# Patient Record
Sex: Male | Born: 1945 | Race: White | Hispanic: No | Marital: Married | Smoking: Never smoker
Health system: Southern US, Community
[De-identification: ages and names within clinical notes are randomized; demographics above are authoritative.]

## PROBLEM LIST (undated history)

## (undated) DIAGNOSIS — Z9889 Other specified postprocedural states: Secondary | ICD-10-CM

## (undated) DIAGNOSIS — C801 Malignant (primary) neoplasm, unspecified: Secondary | ICD-10-CM

## (undated) DIAGNOSIS — I10 Essential (primary) hypertension: Secondary | ICD-10-CM

## (undated) DIAGNOSIS — M1990 Unspecified osteoarthritis, unspecified site: Secondary | ICD-10-CM

## (undated) DIAGNOSIS — K219 Gastro-esophageal reflux disease without esophagitis: Secondary | ICD-10-CM

## (undated) HISTORY — PX: PILONIDAL CYST / SINUS EXCISION: SUR543

---

## 2004-10-05 ENCOUNTER — Observation Stay (HOSPITAL_COMMUNITY): Admission: EM | Admit: 2004-10-05 | Discharge: 2004-10-05 | Payer: Self-pay | Admitting: Emergency Medicine

## 2006-10-16 IMAGING — CR DG CHEST 2V
2 series · 2 of 2 positions shown · non-contrast
Comparison: None.

CLINICAL DATA: Chest pain and tightness.  Pain in throat.  
 CHEST - 2 VIEW:

[w chest pa]
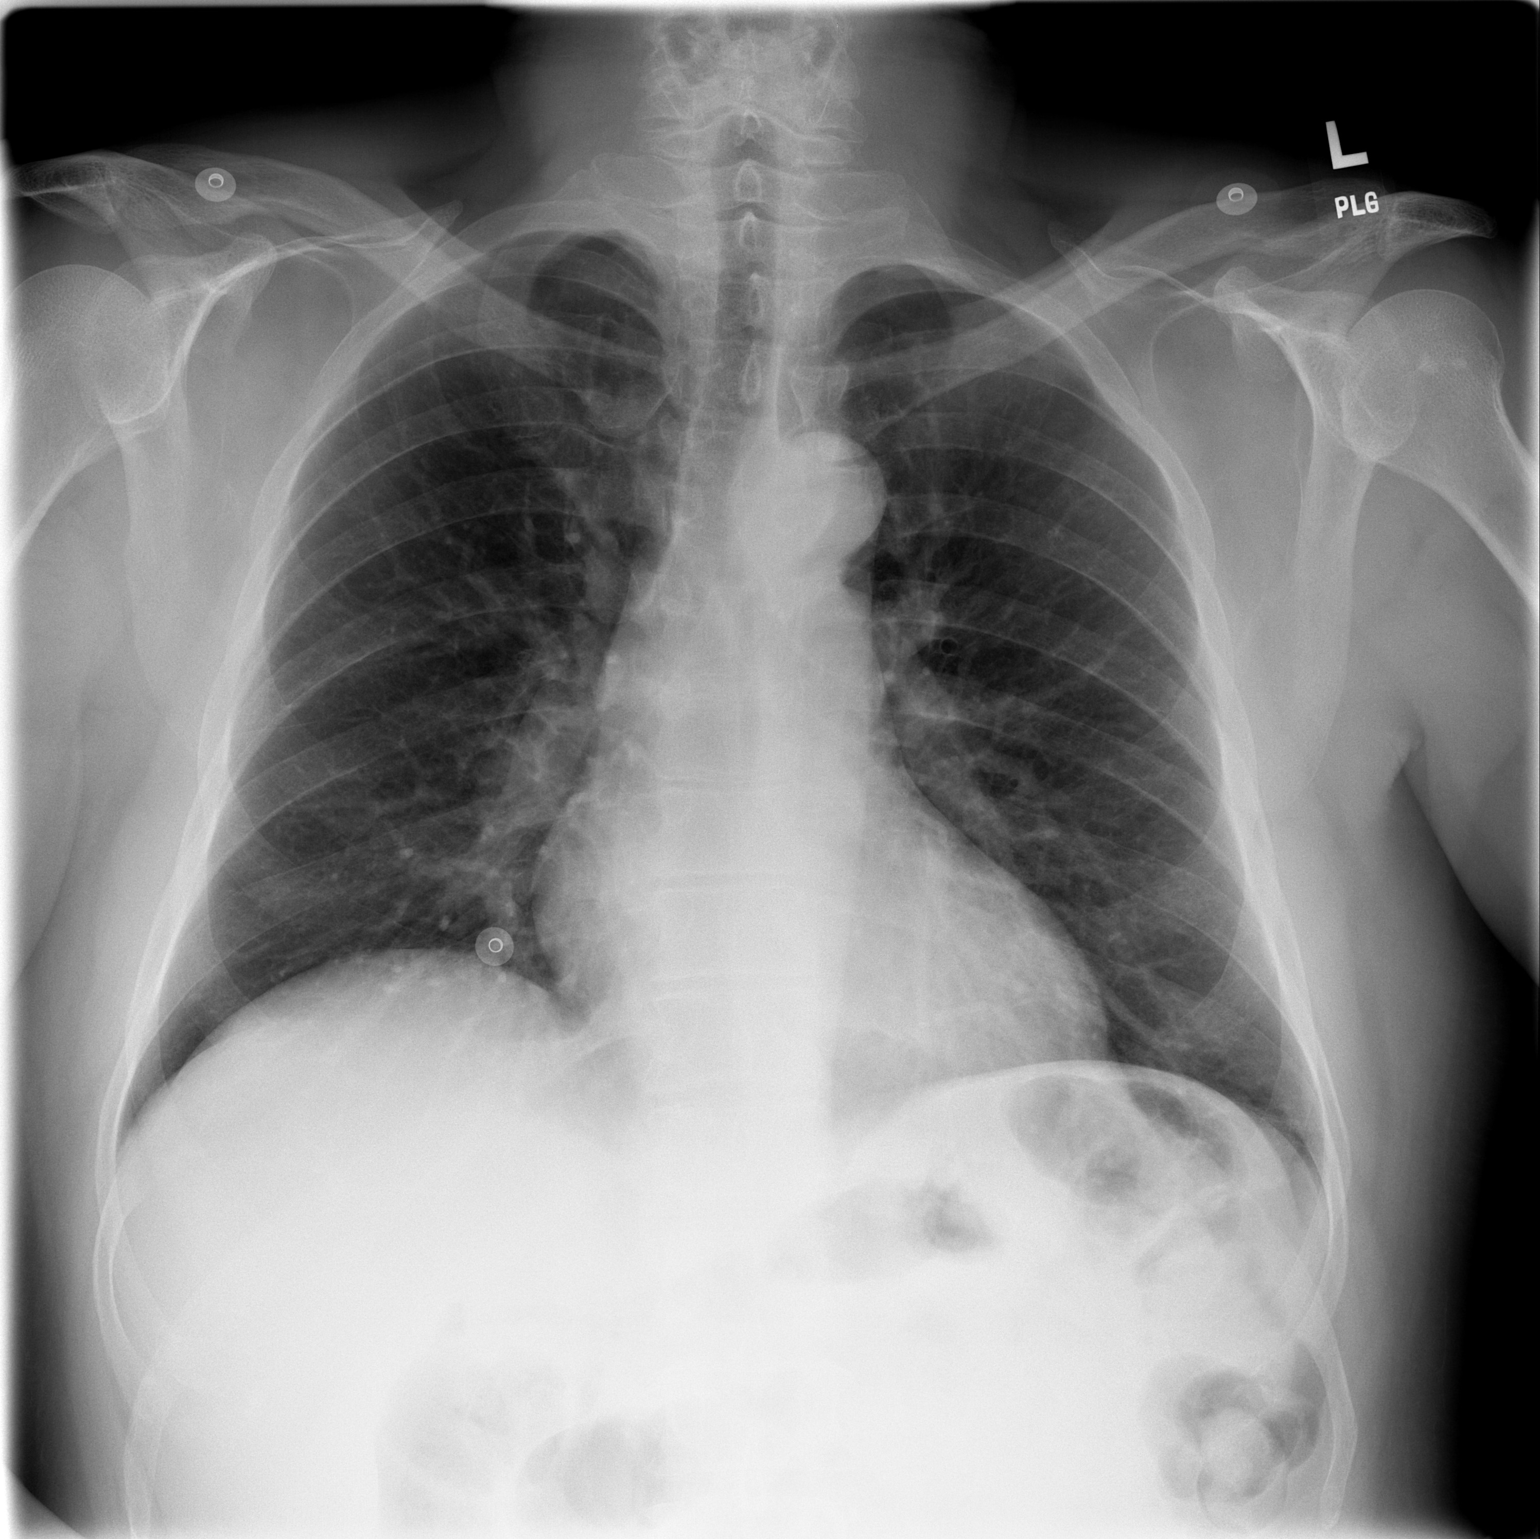

[w chest lat]
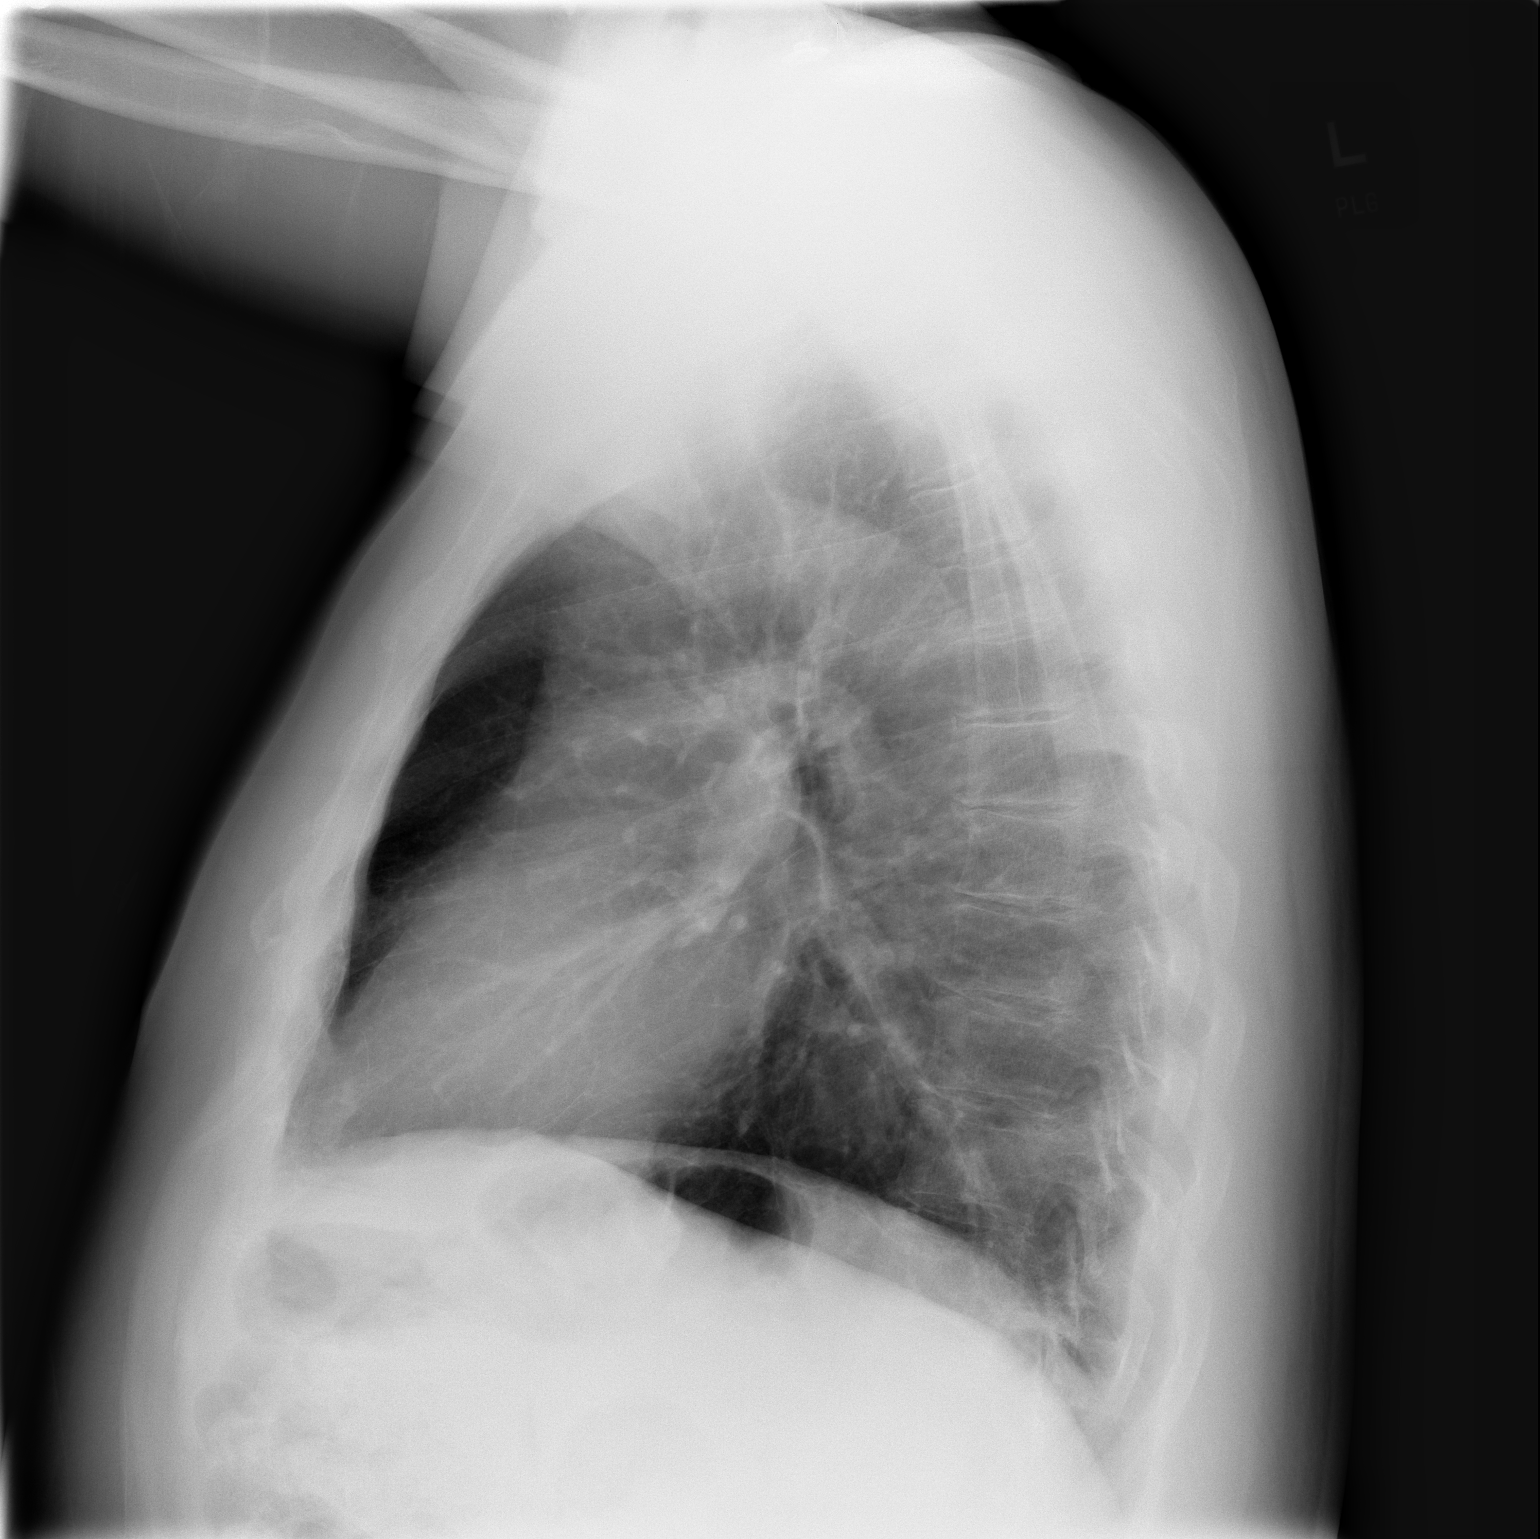

[2 of 2 positions shown; findings below may reference images not displayed]

Lungs are clear.  Heart size is normal.  No pleural effusion.  No focal bony abnormality.
IMPRESSION: No acute disease.

## 2011-06-07 DIAGNOSIS — M25519 Pain in unspecified shoulder: Secondary | ICD-10-CM | POA: Diagnosis not present

## 2011-08-16 DIAGNOSIS — H524 Presbyopia: Secondary | ICD-10-CM | POA: Diagnosis not present

## 2011-08-16 DIAGNOSIS — H259 Unspecified age-related cataract: Secondary | ICD-10-CM | POA: Diagnosis not present

## 2011-08-18 DIAGNOSIS — Z125 Encounter for screening for malignant neoplasm of prostate: Secondary | ICD-10-CM | POA: Diagnosis not present

## 2011-08-18 DIAGNOSIS — N400 Enlarged prostate without lower urinary tract symptoms: Secondary | ICD-10-CM | POA: Diagnosis not present

## 2011-08-18 DIAGNOSIS — Z1331 Encounter for screening for depression: Secondary | ICD-10-CM | POA: Diagnosis not present

## 2011-08-18 DIAGNOSIS — G479 Sleep disorder, unspecified: Secondary | ICD-10-CM | POA: Diagnosis not present

## 2011-08-18 DIAGNOSIS — I10 Essential (primary) hypertension: Secondary | ICD-10-CM | POA: Diagnosis not present

## 2011-08-18 DIAGNOSIS — Z23 Encounter for immunization: Secondary | ICD-10-CM | POA: Diagnosis not present

## 2011-09-05 DIAGNOSIS — R972 Elevated prostate specific antigen [PSA]: Secondary | ICD-10-CM | POA: Diagnosis not present

## 2011-10-10 DIAGNOSIS — R972 Elevated prostate specific antigen [PSA]: Secondary | ICD-10-CM | POA: Diagnosis not present

## 2011-11-11 DIAGNOSIS — L570 Actinic keratosis: Secondary | ICD-10-CM | POA: Diagnosis not present

## 2011-11-11 DIAGNOSIS — L821 Other seborrheic keratosis: Secondary | ICD-10-CM | POA: Diagnosis not present

## 2012-01-31 DIAGNOSIS — G479 Sleep disorder, unspecified: Secondary | ICD-10-CM | POA: Diagnosis not present

## 2012-01-31 DIAGNOSIS — E785 Hyperlipidemia, unspecified: Secondary | ICD-10-CM | POA: Diagnosis not present

## 2012-01-31 DIAGNOSIS — Z23 Encounter for immunization: Secondary | ICD-10-CM | POA: Diagnosis not present

## 2012-01-31 DIAGNOSIS — R7301 Impaired fasting glucose: Secondary | ICD-10-CM | POA: Diagnosis not present

## 2012-01-31 DIAGNOSIS — I10 Essential (primary) hypertension: Secondary | ICD-10-CM | POA: Diagnosis not present

## 2012-08-02 DIAGNOSIS — I10 Essential (primary) hypertension: Secondary | ICD-10-CM | POA: Diagnosis not present

## 2012-08-02 DIAGNOSIS — N400 Enlarged prostate without lower urinary tract symptoms: Secondary | ICD-10-CM | POA: Diagnosis not present

## 2012-08-02 DIAGNOSIS — R7301 Impaired fasting glucose: Secondary | ICD-10-CM | POA: Diagnosis not present

## 2012-08-02 DIAGNOSIS — E785 Hyperlipidemia, unspecified: Secondary | ICD-10-CM | POA: Diagnosis not present

## 2012-08-02 DIAGNOSIS — G479 Sleep disorder, unspecified: Secondary | ICD-10-CM | POA: Diagnosis not present

## 2012-10-24 DIAGNOSIS — M25559 Pain in unspecified hip: Secondary | ICD-10-CM | POA: Diagnosis not present

## 2012-10-24 DIAGNOSIS — M25569 Pain in unspecified knee: Secondary | ICD-10-CM | POA: Diagnosis not present

## 2013-01-15 DIAGNOSIS — Z23 Encounter for immunization: Secondary | ICD-10-CM | POA: Diagnosis not present

## 2013-02-01 DIAGNOSIS — E785 Hyperlipidemia, unspecified: Secondary | ICD-10-CM | POA: Diagnosis not present

## 2013-02-01 DIAGNOSIS — R7301 Impaired fasting glucose: Secondary | ICD-10-CM | POA: Diagnosis not present

## 2013-02-01 DIAGNOSIS — Z1331 Encounter for screening for depression: Secondary | ICD-10-CM | POA: Diagnosis not present

## 2013-02-01 DIAGNOSIS — I10 Essential (primary) hypertension: Secondary | ICD-10-CM | POA: Diagnosis not present

## 2013-02-01 DIAGNOSIS — G479 Sleep disorder, unspecified: Secondary | ICD-10-CM | POA: Diagnosis not present

## 2013-02-01 DIAGNOSIS — N400 Enlarged prostate without lower urinary tract symptoms: Secondary | ICD-10-CM | POA: Diagnosis not present

## 2013-02-01 DIAGNOSIS — K219 Gastro-esophageal reflux disease without esophagitis: Secondary | ICD-10-CM | POA: Diagnosis not present

## 2013-02-01 DIAGNOSIS — Z125 Encounter for screening for malignant neoplasm of prostate: Secondary | ICD-10-CM | POA: Diagnosis not present

## 2013-08-01 DIAGNOSIS — G479 Sleep disorder, unspecified: Secondary | ICD-10-CM | POA: Diagnosis not present

## 2013-08-01 DIAGNOSIS — I10 Essential (primary) hypertension: Secondary | ICD-10-CM | POA: Diagnosis not present

## 2013-08-01 DIAGNOSIS — N400 Enlarged prostate without lower urinary tract symptoms: Secondary | ICD-10-CM | POA: Diagnosis not present

## 2013-08-01 DIAGNOSIS — E785 Hyperlipidemia, unspecified: Secondary | ICD-10-CM | POA: Diagnosis not present

## 2013-08-01 DIAGNOSIS — Z23 Encounter for immunization: Secondary | ICD-10-CM | POA: Diagnosis not present

## 2013-08-01 DIAGNOSIS — R7309 Other abnormal glucose: Secondary | ICD-10-CM | POA: Diagnosis not present

## 2013-10-07 DIAGNOSIS — H1089 Other conjunctivitis: Secondary | ICD-10-CM | POA: Diagnosis not present

## 2014-01-01 DIAGNOSIS — Z23 Encounter for immunization: Secondary | ICD-10-CM | POA: Diagnosis not present

## 2014-02-03 DIAGNOSIS — Z0000 Encounter for general adult medical examination without abnormal findings: Secondary | ICD-10-CM | POA: Diagnosis not present

## 2014-02-03 DIAGNOSIS — E785 Hyperlipidemia, unspecified: Secondary | ICD-10-CM | POA: Diagnosis not present

## 2014-02-03 DIAGNOSIS — I10 Essential (primary) hypertension: Secondary | ICD-10-CM | POA: Diagnosis not present

## 2014-02-03 DIAGNOSIS — N400 Enlarged prostate without lower urinary tract symptoms: Secondary | ICD-10-CM | POA: Diagnosis not present

## 2014-02-03 DIAGNOSIS — K219 Gastro-esophageal reflux disease without esophagitis: Secondary | ICD-10-CM | POA: Diagnosis not present

## 2014-02-03 DIAGNOSIS — E6609 Other obesity due to excess calories: Secondary | ICD-10-CM | POA: Diagnosis not present

## 2014-02-03 DIAGNOSIS — G4700 Insomnia, unspecified: Secondary | ICD-10-CM | POA: Diagnosis not present

## 2014-02-03 DIAGNOSIS — R739 Hyperglycemia, unspecified: Secondary | ICD-10-CM | POA: Diagnosis not present

## 2014-04-18 DIAGNOSIS — M25562 Pain in left knee: Secondary | ICD-10-CM | POA: Diagnosis not present

## 2014-05-13 DIAGNOSIS — M1712 Unilateral primary osteoarthritis, left knee: Secondary | ICD-10-CM | POA: Diagnosis not present

## 2014-05-13 DIAGNOSIS — M25561 Pain in right knee: Secondary | ICD-10-CM | POA: Diagnosis not present

## 2014-07-24 DIAGNOSIS — L821 Other seborrheic keratosis: Secondary | ICD-10-CM | POA: Diagnosis not present

## 2014-07-24 DIAGNOSIS — L820 Inflamed seborrheic keratosis: Secondary | ICD-10-CM | POA: Diagnosis not present

## 2014-07-24 DIAGNOSIS — L570 Actinic keratosis: Secondary | ICD-10-CM | POA: Diagnosis not present

## 2014-07-24 DIAGNOSIS — D225 Melanocytic nevi of trunk: Secondary | ICD-10-CM | POA: Diagnosis not present

## 2014-08-04 DIAGNOSIS — G4700 Insomnia, unspecified: Secondary | ICD-10-CM | POA: Diagnosis not present

## 2014-08-04 DIAGNOSIS — N400 Enlarged prostate without lower urinary tract symptoms: Secondary | ICD-10-CM | POA: Diagnosis not present

## 2014-08-04 DIAGNOSIS — I10 Essential (primary) hypertension: Secondary | ICD-10-CM | POA: Diagnosis not present

## 2014-08-04 DIAGNOSIS — R739 Hyperglycemia, unspecified: Secondary | ICD-10-CM | POA: Diagnosis not present

## 2015-01-06 DIAGNOSIS — Z23 Encounter for immunization: Secondary | ICD-10-CM | POA: Diagnosis not present

## 2015-01-14 DIAGNOSIS — Z86010 Personal history of colonic polyps: Secondary | ICD-10-CM | POA: Diagnosis not present

## 2015-02-04 DIAGNOSIS — N400 Enlarged prostate without lower urinary tract symptoms: Secondary | ICD-10-CM | POA: Diagnosis not present

## 2015-02-04 DIAGNOSIS — Z1389 Encounter for screening for other disorder: Secondary | ICD-10-CM | POA: Diagnosis not present

## 2015-02-04 DIAGNOSIS — E785 Hyperlipidemia, unspecified: Secondary | ICD-10-CM | POA: Diagnosis not present

## 2015-02-04 DIAGNOSIS — G4700 Insomnia, unspecified: Secondary | ICD-10-CM | POA: Diagnosis not present

## 2015-02-04 DIAGNOSIS — R739 Hyperglycemia, unspecified: Secondary | ICD-10-CM | POA: Diagnosis not present

## 2015-02-04 DIAGNOSIS — Z125 Encounter for screening for malignant neoplasm of prostate: Secondary | ICD-10-CM | POA: Diagnosis not present

## 2015-02-04 DIAGNOSIS — I10 Essential (primary) hypertension: Secondary | ICD-10-CM | POA: Diagnosis not present

## 2015-02-05 DIAGNOSIS — Z01818 Encounter for other preprocedural examination: Secondary | ICD-10-CM | POA: Diagnosis not present

## 2015-02-05 DIAGNOSIS — K635 Polyp of colon: Secondary | ICD-10-CM | POA: Diagnosis not present

## 2015-02-20 DIAGNOSIS — K635 Polyp of colon: Secondary | ICD-10-CM | POA: Diagnosis not present

## 2015-02-25 DIAGNOSIS — Z86010 Personal history of colonic polyps: Secondary | ICD-10-CM | POA: Diagnosis not present

## 2015-03-05 DIAGNOSIS — R972 Elevated prostate specific antigen [PSA]: Secondary | ICD-10-CM | POA: Diagnosis not present

## 2015-03-06 DIAGNOSIS — M1712 Unilateral primary osteoarthritis, left knee: Secondary | ICD-10-CM | POA: Diagnosis not present

## 2015-03-06 DIAGNOSIS — S76312A Strain of muscle, fascia and tendon of the posterior muscle group at thigh level, left thigh, initial encounter: Secondary | ICD-10-CM | POA: Diagnosis not present

## 2015-04-21 DIAGNOSIS — Z0000 Encounter for general adult medical examination without abnormal findings: Secondary | ICD-10-CM | POA: Diagnosis not present

## 2015-04-21 DIAGNOSIS — R972 Elevated prostate specific antigen [PSA]: Secondary | ICD-10-CM | POA: Diagnosis not present

## 2015-05-13 DIAGNOSIS — N411 Chronic prostatitis: Secondary | ICD-10-CM | POA: Diagnosis not present

## 2015-05-13 DIAGNOSIS — R972 Elevated prostate specific antigen [PSA]: Secondary | ICD-10-CM | POA: Diagnosis not present

## 2015-05-22 DIAGNOSIS — M9905 Segmental and somatic dysfunction of pelvic region: Secondary | ICD-10-CM | POA: Diagnosis not present

## 2015-05-22 DIAGNOSIS — M2142 Flat foot [pes planus] (acquired), left foot: Secondary | ICD-10-CM | POA: Diagnosis not present

## 2015-05-22 DIAGNOSIS — M25562 Pain in left knee: Secondary | ICD-10-CM | POA: Diagnosis not present

## 2015-05-22 DIAGNOSIS — M2141 Flat foot [pes planus] (acquired), right foot: Secondary | ICD-10-CM | POA: Diagnosis not present

## 2015-05-22 DIAGNOSIS — M9906 Segmental and somatic dysfunction of lower extremity: Secondary | ICD-10-CM | POA: Diagnosis not present

## 2015-05-22 DIAGNOSIS — M461 Sacroiliitis, not elsewhere classified: Secondary | ICD-10-CM | POA: Diagnosis not present

## 2015-05-22 DIAGNOSIS — M5136 Other intervertebral disc degeneration, lumbar region: Secondary | ICD-10-CM | POA: Diagnosis not present

## 2015-05-22 DIAGNOSIS — M25561 Pain in right knee: Secondary | ICD-10-CM | POA: Diagnosis not present

## 2015-05-22 DIAGNOSIS — M9903 Segmental and somatic dysfunction of lumbar region: Secondary | ICD-10-CM | POA: Diagnosis not present

## 2015-05-25 DIAGNOSIS — M25561 Pain in right knee: Secondary | ICD-10-CM | POA: Diagnosis not present

## 2015-05-25 DIAGNOSIS — M25562 Pain in left knee: Secondary | ICD-10-CM | POA: Diagnosis not present

## 2015-05-25 DIAGNOSIS — M9906 Segmental and somatic dysfunction of lower extremity: Secondary | ICD-10-CM | POA: Diagnosis not present

## 2015-05-25 DIAGNOSIS — M2142 Flat foot [pes planus] (acquired), left foot: Secondary | ICD-10-CM | POA: Diagnosis not present

## 2015-05-25 DIAGNOSIS — M461 Sacroiliitis, not elsewhere classified: Secondary | ICD-10-CM | POA: Diagnosis not present

## 2015-05-25 DIAGNOSIS — M9905 Segmental and somatic dysfunction of pelvic region: Secondary | ICD-10-CM | POA: Diagnosis not present

## 2015-05-25 DIAGNOSIS — M9903 Segmental and somatic dysfunction of lumbar region: Secondary | ICD-10-CM | POA: Diagnosis not present

## 2015-05-25 DIAGNOSIS — M2141 Flat foot [pes planus] (acquired), right foot: Secondary | ICD-10-CM | POA: Diagnosis not present

## 2015-05-25 DIAGNOSIS — M5136 Other intervertebral disc degeneration, lumbar region: Secondary | ICD-10-CM | POA: Diagnosis not present

## 2015-05-26 DIAGNOSIS — M9903 Segmental and somatic dysfunction of lumbar region: Secondary | ICD-10-CM | POA: Diagnosis not present

## 2015-05-26 DIAGNOSIS — M2141 Flat foot [pes planus] (acquired), right foot: Secondary | ICD-10-CM | POA: Diagnosis not present

## 2015-05-26 DIAGNOSIS — M25561 Pain in right knee: Secondary | ICD-10-CM | POA: Diagnosis not present

## 2015-05-26 DIAGNOSIS — M5136 Other intervertebral disc degeneration, lumbar region: Secondary | ICD-10-CM | POA: Diagnosis not present

## 2015-05-26 DIAGNOSIS — M25562 Pain in left knee: Secondary | ICD-10-CM | POA: Diagnosis not present

## 2015-05-26 DIAGNOSIS — M2142 Flat foot [pes planus] (acquired), left foot: Secondary | ICD-10-CM | POA: Diagnosis not present

## 2015-05-26 DIAGNOSIS — M9905 Segmental and somatic dysfunction of pelvic region: Secondary | ICD-10-CM | POA: Diagnosis not present

## 2015-05-26 DIAGNOSIS — M461 Sacroiliitis, not elsewhere classified: Secondary | ICD-10-CM | POA: Diagnosis not present

## 2015-05-26 DIAGNOSIS — M9906 Segmental and somatic dysfunction of lower extremity: Secondary | ICD-10-CM | POA: Diagnosis not present

## 2015-05-28 DIAGNOSIS — M25561 Pain in right knee: Secondary | ICD-10-CM | POA: Diagnosis not present

## 2015-05-28 DIAGNOSIS — M461 Sacroiliitis, not elsewhere classified: Secondary | ICD-10-CM | POA: Diagnosis not present

## 2015-05-28 DIAGNOSIS — M5136 Other intervertebral disc degeneration, lumbar region: Secondary | ICD-10-CM | POA: Diagnosis not present

## 2015-05-28 DIAGNOSIS — M2142 Flat foot [pes planus] (acquired), left foot: Secondary | ICD-10-CM | POA: Diagnosis not present

## 2015-05-28 DIAGNOSIS — M25562 Pain in left knee: Secondary | ICD-10-CM | POA: Diagnosis not present

## 2015-05-28 DIAGNOSIS — M9903 Segmental and somatic dysfunction of lumbar region: Secondary | ICD-10-CM | POA: Diagnosis not present

## 2015-05-28 DIAGNOSIS — M2141 Flat foot [pes planus] (acquired), right foot: Secondary | ICD-10-CM | POA: Diagnosis not present

## 2015-05-28 DIAGNOSIS — M9906 Segmental and somatic dysfunction of lower extremity: Secondary | ICD-10-CM | POA: Diagnosis not present

## 2015-05-28 DIAGNOSIS — M9905 Segmental and somatic dysfunction of pelvic region: Secondary | ICD-10-CM | POA: Diagnosis not present

## 2015-06-01 DIAGNOSIS — M461 Sacroiliitis, not elsewhere classified: Secondary | ICD-10-CM | POA: Diagnosis not present

## 2015-06-01 DIAGNOSIS — M25562 Pain in left knee: Secondary | ICD-10-CM | POA: Diagnosis not present

## 2015-06-01 DIAGNOSIS — M9903 Segmental and somatic dysfunction of lumbar region: Secondary | ICD-10-CM | POA: Diagnosis not present

## 2015-06-01 DIAGNOSIS — M2141 Flat foot [pes planus] (acquired), right foot: Secondary | ICD-10-CM | POA: Diagnosis not present

## 2015-06-01 DIAGNOSIS — M5136 Other intervertebral disc degeneration, lumbar region: Secondary | ICD-10-CM | POA: Diagnosis not present

## 2015-06-01 DIAGNOSIS — M2142 Flat foot [pes planus] (acquired), left foot: Secondary | ICD-10-CM | POA: Diagnosis not present

## 2015-06-01 DIAGNOSIS — M9906 Segmental and somatic dysfunction of lower extremity: Secondary | ICD-10-CM | POA: Diagnosis not present

## 2015-06-01 DIAGNOSIS — M9905 Segmental and somatic dysfunction of pelvic region: Secondary | ICD-10-CM | POA: Diagnosis not present

## 2015-06-01 DIAGNOSIS — M25561 Pain in right knee: Secondary | ICD-10-CM | POA: Diagnosis not present

## 2015-06-02 DIAGNOSIS — M9906 Segmental and somatic dysfunction of lower extremity: Secondary | ICD-10-CM | POA: Diagnosis not present

## 2015-06-02 DIAGNOSIS — M9905 Segmental and somatic dysfunction of pelvic region: Secondary | ICD-10-CM | POA: Diagnosis not present

## 2015-06-02 DIAGNOSIS — M5136 Other intervertebral disc degeneration, lumbar region: Secondary | ICD-10-CM | POA: Diagnosis not present

## 2015-06-02 DIAGNOSIS — M2142 Flat foot [pes planus] (acquired), left foot: Secondary | ICD-10-CM | POA: Diagnosis not present

## 2015-06-02 DIAGNOSIS — M461 Sacroiliitis, not elsewhere classified: Secondary | ICD-10-CM | POA: Diagnosis not present

## 2015-06-02 DIAGNOSIS — M2141 Flat foot [pes planus] (acquired), right foot: Secondary | ICD-10-CM | POA: Diagnosis not present

## 2015-06-02 DIAGNOSIS — M9903 Segmental and somatic dysfunction of lumbar region: Secondary | ICD-10-CM | POA: Diagnosis not present

## 2015-06-02 DIAGNOSIS — M25562 Pain in left knee: Secondary | ICD-10-CM | POA: Diagnosis not present

## 2015-06-02 DIAGNOSIS — M25561 Pain in right knee: Secondary | ICD-10-CM | POA: Diagnosis not present

## 2015-06-04 DIAGNOSIS — M9903 Segmental and somatic dysfunction of lumbar region: Secondary | ICD-10-CM | POA: Diagnosis not present

## 2015-06-04 DIAGNOSIS — M461 Sacroiliitis, not elsewhere classified: Secondary | ICD-10-CM | POA: Diagnosis not present

## 2015-06-04 DIAGNOSIS — M25561 Pain in right knee: Secondary | ICD-10-CM | POA: Diagnosis not present

## 2015-06-04 DIAGNOSIS — M2142 Flat foot [pes planus] (acquired), left foot: Secondary | ICD-10-CM | POA: Diagnosis not present

## 2015-06-04 DIAGNOSIS — M5136 Other intervertebral disc degeneration, lumbar region: Secondary | ICD-10-CM | POA: Diagnosis not present

## 2015-06-04 DIAGNOSIS — M9906 Segmental and somatic dysfunction of lower extremity: Secondary | ICD-10-CM | POA: Diagnosis not present

## 2015-06-04 DIAGNOSIS — M2141 Flat foot [pes planus] (acquired), right foot: Secondary | ICD-10-CM | POA: Diagnosis not present

## 2015-06-04 DIAGNOSIS — M25562 Pain in left knee: Secondary | ICD-10-CM | POA: Diagnosis not present

## 2015-06-04 DIAGNOSIS — M9905 Segmental and somatic dysfunction of pelvic region: Secondary | ICD-10-CM | POA: Diagnosis not present

## 2015-06-08 DIAGNOSIS — M5136 Other intervertebral disc degeneration, lumbar region: Secondary | ICD-10-CM | POA: Diagnosis not present

## 2015-06-08 DIAGNOSIS — M25562 Pain in left knee: Secondary | ICD-10-CM | POA: Diagnosis not present

## 2015-06-08 DIAGNOSIS — M9906 Segmental and somatic dysfunction of lower extremity: Secondary | ICD-10-CM | POA: Diagnosis not present

## 2015-06-08 DIAGNOSIS — M2142 Flat foot [pes planus] (acquired), left foot: Secondary | ICD-10-CM | POA: Diagnosis not present

## 2015-06-08 DIAGNOSIS — M9905 Segmental and somatic dysfunction of pelvic region: Secondary | ICD-10-CM | POA: Diagnosis not present

## 2015-06-08 DIAGNOSIS — M9903 Segmental and somatic dysfunction of lumbar region: Secondary | ICD-10-CM | POA: Diagnosis not present

## 2015-06-08 DIAGNOSIS — M25561 Pain in right knee: Secondary | ICD-10-CM | POA: Diagnosis not present

## 2015-06-08 DIAGNOSIS — M2141 Flat foot [pes planus] (acquired), right foot: Secondary | ICD-10-CM | POA: Diagnosis not present

## 2015-06-08 DIAGNOSIS — M461 Sacroiliitis, not elsewhere classified: Secondary | ICD-10-CM | POA: Diagnosis not present

## 2015-06-09 DIAGNOSIS — M25561 Pain in right knee: Secondary | ICD-10-CM | POA: Diagnosis not present

## 2015-06-09 DIAGNOSIS — M9905 Segmental and somatic dysfunction of pelvic region: Secondary | ICD-10-CM | POA: Diagnosis not present

## 2015-06-09 DIAGNOSIS — M2142 Flat foot [pes planus] (acquired), left foot: Secondary | ICD-10-CM | POA: Diagnosis not present

## 2015-06-09 DIAGNOSIS — M9903 Segmental and somatic dysfunction of lumbar region: Secondary | ICD-10-CM | POA: Diagnosis not present

## 2015-06-09 DIAGNOSIS — M9906 Segmental and somatic dysfunction of lower extremity: Secondary | ICD-10-CM | POA: Diagnosis not present

## 2015-06-09 DIAGNOSIS — M2141 Flat foot [pes planus] (acquired), right foot: Secondary | ICD-10-CM | POA: Diagnosis not present

## 2015-06-09 DIAGNOSIS — M5136 Other intervertebral disc degeneration, lumbar region: Secondary | ICD-10-CM | POA: Diagnosis not present

## 2015-06-09 DIAGNOSIS — M25562 Pain in left knee: Secondary | ICD-10-CM | POA: Diagnosis not present

## 2015-06-09 DIAGNOSIS — M461 Sacroiliitis, not elsewhere classified: Secondary | ICD-10-CM | POA: Diagnosis not present

## 2015-06-11 DIAGNOSIS — M25562 Pain in left knee: Secondary | ICD-10-CM | POA: Diagnosis not present

## 2015-06-11 DIAGNOSIS — M2142 Flat foot [pes planus] (acquired), left foot: Secondary | ICD-10-CM | POA: Diagnosis not present

## 2015-06-11 DIAGNOSIS — M2141 Flat foot [pes planus] (acquired), right foot: Secondary | ICD-10-CM | POA: Diagnosis not present

## 2015-06-11 DIAGNOSIS — M461 Sacroiliitis, not elsewhere classified: Secondary | ICD-10-CM | POA: Diagnosis not present

## 2015-06-11 DIAGNOSIS — M9903 Segmental and somatic dysfunction of lumbar region: Secondary | ICD-10-CM | POA: Diagnosis not present

## 2015-06-11 DIAGNOSIS — M9905 Segmental and somatic dysfunction of pelvic region: Secondary | ICD-10-CM | POA: Diagnosis not present

## 2015-06-11 DIAGNOSIS — M9906 Segmental and somatic dysfunction of lower extremity: Secondary | ICD-10-CM | POA: Diagnosis not present

## 2015-06-11 DIAGNOSIS — M5136 Other intervertebral disc degeneration, lumbar region: Secondary | ICD-10-CM | POA: Diagnosis not present

## 2015-06-11 DIAGNOSIS — M25561 Pain in right knee: Secondary | ICD-10-CM | POA: Diagnosis not present

## 2015-06-16 DIAGNOSIS — M2141 Flat foot [pes planus] (acquired), right foot: Secondary | ICD-10-CM | POA: Diagnosis not present

## 2015-06-16 DIAGNOSIS — M5136 Other intervertebral disc degeneration, lumbar region: Secondary | ICD-10-CM | POA: Diagnosis not present

## 2015-06-16 DIAGNOSIS — M25562 Pain in left knee: Secondary | ICD-10-CM | POA: Diagnosis not present

## 2015-06-16 DIAGNOSIS — M461 Sacroiliitis, not elsewhere classified: Secondary | ICD-10-CM | POA: Diagnosis not present

## 2015-06-16 DIAGNOSIS — M9903 Segmental and somatic dysfunction of lumbar region: Secondary | ICD-10-CM | POA: Diagnosis not present

## 2015-06-16 DIAGNOSIS — M9905 Segmental and somatic dysfunction of pelvic region: Secondary | ICD-10-CM | POA: Diagnosis not present

## 2015-06-16 DIAGNOSIS — M25561 Pain in right knee: Secondary | ICD-10-CM | POA: Diagnosis not present

## 2015-06-16 DIAGNOSIS — M9906 Segmental and somatic dysfunction of lower extremity: Secondary | ICD-10-CM | POA: Diagnosis not present

## 2015-06-16 DIAGNOSIS — M2142 Flat foot [pes planus] (acquired), left foot: Secondary | ICD-10-CM | POA: Diagnosis not present

## 2015-06-18 DIAGNOSIS — M9903 Segmental and somatic dysfunction of lumbar region: Secondary | ICD-10-CM | POA: Diagnosis not present

## 2015-06-18 DIAGNOSIS — M25562 Pain in left knee: Secondary | ICD-10-CM | POA: Diagnosis not present

## 2015-06-18 DIAGNOSIS — M9905 Segmental and somatic dysfunction of pelvic region: Secondary | ICD-10-CM | POA: Diagnosis not present

## 2015-06-18 DIAGNOSIS — M2142 Flat foot [pes planus] (acquired), left foot: Secondary | ICD-10-CM | POA: Diagnosis not present

## 2015-06-18 DIAGNOSIS — M25561 Pain in right knee: Secondary | ICD-10-CM | POA: Diagnosis not present

## 2015-06-18 DIAGNOSIS — M2141 Flat foot [pes planus] (acquired), right foot: Secondary | ICD-10-CM | POA: Diagnosis not present

## 2015-06-18 DIAGNOSIS — M9906 Segmental and somatic dysfunction of lower extremity: Secondary | ICD-10-CM | POA: Diagnosis not present

## 2015-06-18 DIAGNOSIS — M5136 Other intervertebral disc degeneration, lumbar region: Secondary | ICD-10-CM | POA: Diagnosis not present

## 2015-06-18 DIAGNOSIS — M461 Sacroiliitis, not elsewhere classified: Secondary | ICD-10-CM | POA: Diagnosis not present

## 2015-06-22 DIAGNOSIS — M9903 Segmental and somatic dysfunction of lumbar region: Secondary | ICD-10-CM | POA: Diagnosis not present

## 2015-06-22 DIAGNOSIS — M5136 Other intervertebral disc degeneration, lumbar region: Secondary | ICD-10-CM | POA: Diagnosis not present

## 2015-06-22 DIAGNOSIS — M461 Sacroiliitis, not elsewhere classified: Secondary | ICD-10-CM | POA: Diagnosis not present

## 2015-06-22 DIAGNOSIS — M2141 Flat foot [pes planus] (acquired), right foot: Secondary | ICD-10-CM | POA: Diagnosis not present

## 2015-06-22 DIAGNOSIS — M9906 Segmental and somatic dysfunction of lower extremity: Secondary | ICD-10-CM | POA: Diagnosis not present

## 2015-06-22 DIAGNOSIS — M2142 Flat foot [pes planus] (acquired), left foot: Secondary | ICD-10-CM | POA: Diagnosis not present

## 2015-06-22 DIAGNOSIS — M25561 Pain in right knee: Secondary | ICD-10-CM | POA: Diagnosis not present

## 2015-06-22 DIAGNOSIS — M25562 Pain in left knee: Secondary | ICD-10-CM | POA: Diagnosis not present

## 2015-06-22 DIAGNOSIS — M9905 Segmental and somatic dysfunction of pelvic region: Secondary | ICD-10-CM | POA: Diagnosis not present

## 2015-06-25 DIAGNOSIS — M5136 Other intervertebral disc degeneration, lumbar region: Secondary | ICD-10-CM | POA: Diagnosis not present

## 2015-06-25 DIAGNOSIS — M461 Sacroiliitis, not elsewhere classified: Secondary | ICD-10-CM | POA: Diagnosis not present

## 2015-06-25 DIAGNOSIS — M2141 Flat foot [pes planus] (acquired), right foot: Secondary | ICD-10-CM | POA: Diagnosis not present

## 2015-06-25 DIAGNOSIS — M9903 Segmental and somatic dysfunction of lumbar region: Secondary | ICD-10-CM | POA: Diagnosis not present

## 2015-06-25 DIAGNOSIS — M9905 Segmental and somatic dysfunction of pelvic region: Secondary | ICD-10-CM | POA: Diagnosis not present

## 2015-06-25 DIAGNOSIS — M25561 Pain in right knee: Secondary | ICD-10-CM | POA: Diagnosis not present

## 2015-06-25 DIAGNOSIS — M2142 Flat foot [pes planus] (acquired), left foot: Secondary | ICD-10-CM | POA: Diagnosis not present

## 2015-06-25 DIAGNOSIS — M9906 Segmental and somatic dysfunction of lower extremity: Secondary | ICD-10-CM | POA: Diagnosis not present

## 2015-06-25 DIAGNOSIS — M25562 Pain in left knee: Secondary | ICD-10-CM | POA: Diagnosis not present

## 2015-07-02 DIAGNOSIS — M25561 Pain in right knee: Secondary | ICD-10-CM | POA: Diagnosis not present

## 2015-07-02 DIAGNOSIS — M9905 Segmental and somatic dysfunction of pelvic region: Secondary | ICD-10-CM | POA: Diagnosis not present

## 2015-07-02 DIAGNOSIS — M461 Sacroiliitis, not elsewhere classified: Secondary | ICD-10-CM | POA: Diagnosis not present

## 2015-07-02 DIAGNOSIS — M5136 Other intervertebral disc degeneration, lumbar region: Secondary | ICD-10-CM | POA: Diagnosis not present

## 2015-07-02 DIAGNOSIS — M2141 Flat foot [pes planus] (acquired), right foot: Secondary | ICD-10-CM | POA: Diagnosis not present

## 2015-07-02 DIAGNOSIS — M2142 Flat foot [pes planus] (acquired), left foot: Secondary | ICD-10-CM | POA: Diagnosis not present

## 2015-07-02 DIAGNOSIS — M25562 Pain in left knee: Secondary | ICD-10-CM | POA: Diagnosis not present

## 2015-07-02 DIAGNOSIS — M9903 Segmental and somatic dysfunction of lumbar region: Secondary | ICD-10-CM | POA: Diagnosis not present

## 2015-07-02 DIAGNOSIS — M9906 Segmental and somatic dysfunction of lower extremity: Secondary | ICD-10-CM | POA: Diagnosis not present

## 2015-07-09 DIAGNOSIS — M2142 Flat foot [pes planus] (acquired), left foot: Secondary | ICD-10-CM | POA: Diagnosis not present

## 2015-07-09 DIAGNOSIS — M9906 Segmental and somatic dysfunction of lower extremity: Secondary | ICD-10-CM | POA: Diagnosis not present

## 2015-07-09 DIAGNOSIS — M2141 Flat foot [pes planus] (acquired), right foot: Secondary | ICD-10-CM | POA: Diagnosis not present

## 2015-07-09 DIAGNOSIS — M25561 Pain in right knee: Secondary | ICD-10-CM | POA: Diagnosis not present

## 2015-07-09 DIAGNOSIS — M9903 Segmental and somatic dysfunction of lumbar region: Secondary | ICD-10-CM | POA: Diagnosis not present

## 2015-07-09 DIAGNOSIS — M5136 Other intervertebral disc degeneration, lumbar region: Secondary | ICD-10-CM | POA: Diagnosis not present

## 2015-07-09 DIAGNOSIS — M9905 Segmental and somatic dysfunction of pelvic region: Secondary | ICD-10-CM | POA: Diagnosis not present

## 2015-07-09 DIAGNOSIS — M25562 Pain in left knee: Secondary | ICD-10-CM | POA: Diagnosis not present

## 2015-07-09 DIAGNOSIS — M461 Sacroiliitis, not elsewhere classified: Secondary | ICD-10-CM | POA: Diagnosis not present

## 2015-07-16 DIAGNOSIS — M9905 Segmental and somatic dysfunction of pelvic region: Secondary | ICD-10-CM | POA: Diagnosis not present

## 2015-07-16 DIAGNOSIS — M461 Sacroiliitis, not elsewhere classified: Secondary | ICD-10-CM | POA: Diagnosis not present

## 2015-07-16 DIAGNOSIS — M9903 Segmental and somatic dysfunction of lumbar region: Secondary | ICD-10-CM | POA: Diagnosis not present

## 2015-07-16 DIAGNOSIS — M5136 Other intervertebral disc degeneration, lumbar region: Secondary | ICD-10-CM | POA: Diagnosis not present

## 2015-07-16 DIAGNOSIS — M9906 Segmental and somatic dysfunction of lower extremity: Secondary | ICD-10-CM | POA: Diagnosis not present

## 2015-07-16 DIAGNOSIS — M2141 Flat foot [pes planus] (acquired), right foot: Secondary | ICD-10-CM | POA: Diagnosis not present

## 2015-07-16 DIAGNOSIS — M25562 Pain in left knee: Secondary | ICD-10-CM | POA: Diagnosis not present

## 2015-07-16 DIAGNOSIS — M2142 Flat foot [pes planus] (acquired), left foot: Secondary | ICD-10-CM | POA: Diagnosis not present

## 2015-07-16 DIAGNOSIS — M25561 Pain in right knee: Secondary | ICD-10-CM | POA: Diagnosis not present

## 2015-07-23 DIAGNOSIS — L918 Other hypertrophic disorders of the skin: Secondary | ICD-10-CM | POA: Diagnosis not present

## 2015-07-23 DIAGNOSIS — D1801 Hemangioma of skin and subcutaneous tissue: Secondary | ICD-10-CM | POA: Diagnosis not present

## 2015-07-23 DIAGNOSIS — D225 Melanocytic nevi of trunk: Secondary | ICD-10-CM | POA: Diagnosis not present

## 2015-07-23 DIAGNOSIS — L821 Other seborrheic keratosis: Secondary | ICD-10-CM | POA: Diagnosis not present

## 2015-07-23 DIAGNOSIS — L570 Actinic keratosis: Secondary | ICD-10-CM | POA: Diagnosis not present

## 2015-07-23 DIAGNOSIS — B078 Other viral warts: Secondary | ICD-10-CM | POA: Diagnosis not present

## 2015-07-30 DIAGNOSIS — M25562 Pain in left knee: Secondary | ICD-10-CM | POA: Diagnosis not present

## 2015-07-30 DIAGNOSIS — M25561 Pain in right knee: Secondary | ICD-10-CM | POA: Diagnosis not present

## 2015-07-30 DIAGNOSIS — M9903 Segmental and somatic dysfunction of lumbar region: Secondary | ICD-10-CM | POA: Diagnosis not present

## 2015-07-30 DIAGNOSIS — M2142 Flat foot [pes planus] (acquired), left foot: Secondary | ICD-10-CM | POA: Diagnosis not present

## 2015-07-30 DIAGNOSIS — M9905 Segmental and somatic dysfunction of pelvic region: Secondary | ICD-10-CM | POA: Diagnosis not present

## 2015-07-30 DIAGNOSIS — M5136 Other intervertebral disc degeneration, lumbar region: Secondary | ICD-10-CM | POA: Diagnosis not present

## 2015-07-30 DIAGNOSIS — M9906 Segmental and somatic dysfunction of lower extremity: Secondary | ICD-10-CM | POA: Diagnosis not present

## 2015-07-30 DIAGNOSIS — M461 Sacroiliitis, not elsewhere classified: Secondary | ICD-10-CM | POA: Diagnosis not present

## 2015-07-30 DIAGNOSIS — M2141 Flat foot [pes planus] (acquired), right foot: Secondary | ICD-10-CM | POA: Diagnosis not present

## 2015-08-05 DIAGNOSIS — K219 Gastro-esophageal reflux disease without esophagitis: Secondary | ICD-10-CM | POA: Diagnosis not present

## 2015-08-05 DIAGNOSIS — G4700 Insomnia, unspecified: Secondary | ICD-10-CM | POA: Diagnosis not present

## 2015-08-05 DIAGNOSIS — E785 Hyperlipidemia, unspecified: Secondary | ICD-10-CM | POA: Diagnosis not present

## 2015-08-05 DIAGNOSIS — I10 Essential (primary) hypertension: Secondary | ICD-10-CM | POA: Diagnosis not present

## 2015-08-05 DIAGNOSIS — E6609 Other obesity due to excess calories: Secondary | ICD-10-CM | POA: Diagnosis not present

## 2015-08-05 DIAGNOSIS — M179 Osteoarthritis of knee, unspecified: Secondary | ICD-10-CM | POA: Diagnosis not present

## 2015-08-05 DIAGNOSIS — R739 Hyperglycemia, unspecified: Secondary | ICD-10-CM | POA: Diagnosis not present

## 2015-08-05 DIAGNOSIS — N400 Enlarged prostate without lower urinary tract symptoms: Secondary | ICD-10-CM | POA: Diagnosis not present

## 2015-11-03 DIAGNOSIS — R972 Elevated prostate specific antigen [PSA]: Secondary | ICD-10-CM | POA: Diagnosis not present

## 2015-11-10 DIAGNOSIS — N401 Enlarged prostate with lower urinary tract symptoms: Secondary | ICD-10-CM | POA: Diagnosis not present

## 2015-11-10 DIAGNOSIS — R350 Frequency of micturition: Secondary | ICD-10-CM | POA: Diagnosis not present

## 2015-11-10 DIAGNOSIS — R972 Elevated prostate specific antigen [PSA]: Secondary | ICD-10-CM | POA: Diagnosis not present

## 2015-11-24 DIAGNOSIS — M25511 Pain in right shoulder: Secondary | ICD-10-CM | POA: Diagnosis not present

## 2015-11-24 DIAGNOSIS — M50322 Other cervical disc degeneration at C5-C6 level: Secondary | ICD-10-CM | POA: Diagnosis not present

## 2015-11-24 DIAGNOSIS — M9902 Segmental and somatic dysfunction of thoracic region: Secondary | ICD-10-CM | POA: Diagnosis not present

## 2015-11-24 DIAGNOSIS — M9901 Segmental and somatic dysfunction of cervical region: Secondary | ICD-10-CM | POA: Diagnosis not present

## 2015-11-26 DIAGNOSIS — M7541 Impingement syndrome of right shoulder: Secondary | ICD-10-CM | POA: Diagnosis not present

## 2015-11-26 DIAGNOSIS — M9902 Segmental and somatic dysfunction of thoracic region: Secondary | ICD-10-CM | POA: Diagnosis not present

## 2015-11-26 DIAGNOSIS — M9901 Segmental and somatic dysfunction of cervical region: Secondary | ICD-10-CM | POA: Diagnosis not present

## 2015-11-26 DIAGNOSIS — M50322 Other cervical disc degeneration at C5-C6 level: Secondary | ICD-10-CM | POA: Diagnosis not present

## 2015-11-26 DIAGNOSIS — M25511 Pain in right shoulder: Secondary | ICD-10-CM | POA: Diagnosis not present

## 2015-11-30 DIAGNOSIS — M7541 Impingement syndrome of right shoulder: Secondary | ICD-10-CM | POA: Diagnosis not present

## 2015-11-30 DIAGNOSIS — M25511 Pain in right shoulder: Secondary | ICD-10-CM | POA: Diagnosis not present

## 2015-11-30 DIAGNOSIS — M9901 Segmental and somatic dysfunction of cervical region: Secondary | ICD-10-CM | POA: Diagnosis not present

## 2015-11-30 DIAGNOSIS — M50322 Other cervical disc degeneration at C5-C6 level: Secondary | ICD-10-CM | POA: Diagnosis not present

## 2015-11-30 DIAGNOSIS — M9902 Segmental and somatic dysfunction of thoracic region: Secondary | ICD-10-CM | POA: Diagnosis not present

## 2015-12-02 DIAGNOSIS — M50322 Other cervical disc degeneration at C5-C6 level: Secondary | ICD-10-CM | POA: Diagnosis not present

## 2015-12-02 DIAGNOSIS — M9901 Segmental and somatic dysfunction of cervical region: Secondary | ICD-10-CM | POA: Diagnosis not present

## 2015-12-02 DIAGNOSIS — M9902 Segmental and somatic dysfunction of thoracic region: Secondary | ICD-10-CM | POA: Diagnosis not present

## 2015-12-02 DIAGNOSIS — M25511 Pain in right shoulder: Secondary | ICD-10-CM | POA: Diagnosis not present

## 2015-12-02 DIAGNOSIS — M7541 Impingement syndrome of right shoulder: Secondary | ICD-10-CM | POA: Diagnosis not present

## 2015-12-07 DIAGNOSIS — Z23 Encounter for immunization: Secondary | ICD-10-CM | POA: Diagnosis not present

## 2015-12-08 DIAGNOSIS — M7541 Impingement syndrome of right shoulder: Secondary | ICD-10-CM | POA: Diagnosis not present

## 2015-12-08 DIAGNOSIS — M9902 Segmental and somatic dysfunction of thoracic region: Secondary | ICD-10-CM | POA: Diagnosis not present

## 2015-12-08 DIAGNOSIS — M25511 Pain in right shoulder: Secondary | ICD-10-CM | POA: Diagnosis not present

## 2015-12-08 DIAGNOSIS — M9901 Segmental and somatic dysfunction of cervical region: Secondary | ICD-10-CM | POA: Diagnosis not present

## 2015-12-08 DIAGNOSIS — M50322 Other cervical disc degeneration at C5-C6 level: Secondary | ICD-10-CM | POA: Diagnosis not present

## 2015-12-10 DIAGNOSIS — M50322 Other cervical disc degeneration at C5-C6 level: Secondary | ICD-10-CM | POA: Diagnosis not present

## 2015-12-10 DIAGNOSIS — M9901 Segmental and somatic dysfunction of cervical region: Secondary | ICD-10-CM | POA: Diagnosis not present

## 2015-12-10 DIAGNOSIS — M7541 Impingement syndrome of right shoulder: Secondary | ICD-10-CM | POA: Diagnosis not present

## 2015-12-10 DIAGNOSIS — M9902 Segmental and somatic dysfunction of thoracic region: Secondary | ICD-10-CM | POA: Diagnosis not present

## 2015-12-10 DIAGNOSIS — M25511 Pain in right shoulder: Secondary | ICD-10-CM | POA: Diagnosis not present

## 2015-12-15 DIAGNOSIS — M9901 Segmental and somatic dysfunction of cervical region: Secondary | ICD-10-CM | POA: Diagnosis not present

## 2015-12-15 DIAGNOSIS — M7541 Impingement syndrome of right shoulder: Secondary | ICD-10-CM | POA: Diagnosis not present

## 2015-12-15 DIAGNOSIS — M25511 Pain in right shoulder: Secondary | ICD-10-CM | POA: Diagnosis not present

## 2015-12-15 DIAGNOSIS — M9902 Segmental and somatic dysfunction of thoracic region: Secondary | ICD-10-CM | POA: Diagnosis not present

## 2015-12-15 DIAGNOSIS — M50322 Other cervical disc degeneration at C5-C6 level: Secondary | ICD-10-CM | POA: Diagnosis not present

## 2015-12-17 DIAGNOSIS — M7541 Impingement syndrome of right shoulder: Secondary | ICD-10-CM | POA: Diagnosis not present

## 2015-12-17 DIAGNOSIS — M9902 Segmental and somatic dysfunction of thoracic region: Secondary | ICD-10-CM | POA: Diagnosis not present

## 2015-12-17 DIAGNOSIS — M9901 Segmental and somatic dysfunction of cervical region: Secondary | ICD-10-CM | POA: Diagnosis not present

## 2015-12-17 DIAGNOSIS — M50322 Other cervical disc degeneration at C5-C6 level: Secondary | ICD-10-CM | POA: Diagnosis not present

## 2015-12-17 DIAGNOSIS — M25511 Pain in right shoulder: Secondary | ICD-10-CM | POA: Diagnosis not present

## 2015-12-25 DIAGNOSIS — M7541 Impingement syndrome of right shoulder: Secondary | ICD-10-CM | POA: Diagnosis not present

## 2015-12-25 DIAGNOSIS — M50322 Other cervical disc degeneration at C5-C6 level: Secondary | ICD-10-CM | POA: Diagnosis not present

## 2015-12-25 DIAGNOSIS — M25511 Pain in right shoulder: Secondary | ICD-10-CM | POA: Diagnosis not present

## 2015-12-25 DIAGNOSIS — M9902 Segmental and somatic dysfunction of thoracic region: Secondary | ICD-10-CM | POA: Diagnosis not present

## 2015-12-25 DIAGNOSIS — M9901 Segmental and somatic dysfunction of cervical region: Secondary | ICD-10-CM | POA: Diagnosis not present

## 2015-12-29 DIAGNOSIS — M7541 Impingement syndrome of right shoulder: Secondary | ICD-10-CM | POA: Diagnosis not present

## 2015-12-29 DIAGNOSIS — M25511 Pain in right shoulder: Secondary | ICD-10-CM | POA: Diagnosis not present

## 2015-12-29 DIAGNOSIS — M9902 Segmental and somatic dysfunction of thoracic region: Secondary | ICD-10-CM | POA: Diagnosis not present

## 2015-12-29 DIAGNOSIS — M50322 Other cervical disc degeneration at C5-C6 level: Secondary | ICD-10-CM | POA: Diagnosis not present

## 2015-12-29 DIAGNOSIS — M9901 Segmental and somatic dysfunction of cervical region: Secondary | ICD-10-CM | POA: Diagnosis not present

## 2016-01-05 DIAGNOSIS — M7541 Impingement syndrome of right shoulder: Secondary | ICD-10-CM | POA: Diagnosis not present

## 2016-01-05 DIAGNOSIS — M25511 Pain in right shoulder: Secondary | ICD-10-CM | POA: Diagnosis not present

## 2016-01-05 DIAGNOSIS — M9901 Segmental and somatic dysfunction of cervical region: Secondary | ICD-10-CM | POA: Diagnosis not present

## 2016-01-05 DIAGNOSIS — M50322 Other cervical disc degeneration at C5-C6 level: Secondary | ICD-10-CM | POA: Diagnosis not present

## 2016-01-05 DIAGNOSIS — M9902 Segmental and somatic dysfunction of thoracic region: Secondary | ICD-10-CM | POA: Diagnosis not present

## 2016-01-19 DIAGNOSIS — M9902 Segmental and somatic dysfunction of thoracic region: Secondary | ICD-10-CM | POA: Diagnosis not present

## 2016-01-19 DIAGNOSIS — M25511 Pain in right shoulder: Secondary | ICD-10-CM | POA: Diagnosis not present

## 2016-01-19 DIAGNOSIS — M7541 Impingement syndrome of right shoulder: Secondary | ICD-10-CM | POA: Diagnosis not present

## 2016-01-19 DIAGNOSIS — M9901 Segmental and somatic dysfunction of cervical region: Secondary | ICD-10-CM | POA: Diagnosis not present

## 2016-01-19 DIAGNOSIS — M50322 Other cervical disc degeneration at C5-C6 level: Secondary | ICD-10-CM | POA: Diagnosis not present

## 2016-03-09 DIAGNOSIS — Z1389 Encounter for screening for other disorder: Secondary | ICD-10-CM | POA: Diagnosis not present

## 2016-03-09 DIAGNOSIS — Z0000 Encounter for general adult medical examination without abnormal findings: Secondary | ICD-10-CM | POA: Diagnosis not present

## 2016-03-09 DIAGNOSIS — E6609 Other obesity due to excess calories: Secondary | ICD-10-CM | POA: Diagnosis not present

## 2016-03-09 DIAGNOSIS — I10 Essential (primary) hypertension: Secondary | ICD-10-CM | POA: Diagnosis not present

## 2016-03-09 DIAGNOSIS — R739 Hyperglycemia, unspecified: Secondary | ICD-10-CM | POA: Diagnosis not present

## 2016-03-09 DIAGNOSIS — K219 Gastro-esophageal reflux disease without esophagitis: Secondary | ICD-10-CM | POA: Diagnosis not present

## 2016-03-09 DIAGNOSIS — G4700 Insomnia, unspecified: Secondary | ICD-10-CM | POA: Diagnosis not present

## 2016-06-20 DIAGNOSIS — M1712 Unilateral primary osteoarthritis, left knee: Secondary | ICD-10-CM | POA: Diagnosis not present

## 2016-06-20 DIAGNOSIS — M25562 Pain in left knee: Secondary | ICD-10-CM | POA: Diagnosis not present

## 2016-06-20 DIAGNOSIS — G8929 Other chronic pain: Secondary | ICD-10-CM | POA: Diagnosis not present

## 2016-06-27 DIAGNOSIS — M25562 Pain in left knee: Secondary | ICD-10-CM | POA: Diagnosis not present

## 2016-06-27 DIAGNOSIS — G8929 Other chronic pain: Secondary | ICD-10-CM | POA: Diagnosis not present

## 2016-06-27 DIAGNOSIS — M1712 Unilateral primary osteoarthritis, left knee: Secondary | ICD-10-CM | POA: Diagnosis not present

## 2016-07-04 DIAGNOSIS — J4521 Mild intermittent asthma with (acute) exacerbation: Secondary | ICD-10-CM | POA: Diagnosis not present

## 2016-07-25 DIAGNOSIS — L570 Actinic keratosis: Secondary | ICD-10-CM | POA: Diagnosis not present

## 2016-07-25 DIAGNOSIS — D1801 Hemangioma of skin and subcutaneous tissue: Secondary | ICD-10-CM | POA: Diagnosis not present

## 2016-07-25 DIAGNOSIS — D225 Melanocytic nevi of trunk: Secondary | ICD-10-CM | POA: Diagnosis not present

## 2016-07-25 DIAGNOSIS — L821 Other seborrheic keratosis: Secondary | ICD-10-CM | POA: Diagnosis not present

## 2016-07-25 DIAGNOSIS — L820 Inflamed seborrheic keratosis: Secondary | ICD-10-CM | POA: Diagnosis not present

## 2016-07-26 DIAGNOSIS — M1712 Unilateral primary osteoarthritis, left knee: Secondary | ICD-10-CM | POA: Diagnosis not present

## 2016-08-02 DIAGNOSIS — M1712 Unilateral primary osteoarthritis, left knee: Secondary | ICD-10-CM | POA: Diagnosis not present

## 2016-08-09 DIAGNOSIS — M1712 Unilateral primary osteoarthritis, left knee: Secondary | ICD-10-CM | POA: Diagnosis not present

## 2016-09-07 DIAGNOSIS — E6609 Other obesity due to excess calories: Secondary | ICD-10-CM | POA: Diagnosis not present

## 2016-09-07 DIAGNOSIS — R7303 Prediabetes: Secondary | ICD-10-CM | POA: Diagnosis not present

## 2016-09-07 DIAGNOSIS — I10 Essential (primary) hypertension: Secondary | ICD-10-CM | POA: Diagnosis not present

## 2016-09-07 DIAGNOSIS — G4700 Insomnia, unspecified: Secondary | ICD-10-CM | POA: Diagnosis not present

## 2016-12-13 DIAGNOSIS — Z23 Encounter for immunization: Secondary | ICD-10-CM | POA: Diagnosis not present

## 2017-01-30 DIAGNOSIS — R351 Nocturia: Secondary | ICD-10-CM | POA: Diagnosis not present

## 2017-01-30 DIAGNOSIS — N401 Enlarged prostate with lower urinary tract symptoms: Secondary | ICD-10-CM | POA: Diagnosis not present

## 2017-04-28 DIAGNOSIS — R739 Hyperglycemia, unspecified: Secondary | ICD-10-CM | POA: Diagnosis not present

## 2017-04-28 DIAGNOSIS — Z6836 Body mass index (BMI) 36.0-36.9, adult: Secondary | ICD-10-CM | POA: Diagnosis not present

## 2017-04-28 DIAGNOSIS — Z136 Encounter for screening for cardiovascular disorders: Secondary | ICD-10-CM | POA: Diagnosis not present

## 2017-04-28 DIAGNOSIS — G4700 Insomnia, unspecified: Secondary | ICD-10-CM | POA: Diagnosis not present

## 2017-04-28 DIAGNOSIS — E6609 Other obesity due to excess calories: Secondary | ICD-10-CM | POA: Diagnosis not present

## 2017-04-28 DIAGNOSIS — I10 Essential (primary) hypertension: Secondary | ICD-10-CM | POA: Diagnosis not present

## 2017-04-28 DIAGNOSIS — Z0000 Encounter for general adult medical examination without abnormal findings: Secondary | ICD-10-CM | POA: Diagnosis not present

## 2017-06-21 DIAGNOSIS — E785 Hyperlipidemia, unspecified: Secondary | ICD-10-CM | POA: Diagnosis not present

## 2017-06-21 DIAGNOSIS — Z79899 Other long term (current) drug therapy: Secondary | ICD-10-CM | POA: Diagnosis not present

## 2017-06-26 DIAGNOSIS — H04123 Dry eye syndrome of bilateral lacrimal glands: Secondary | ICD-10-CM | POA: Diagnosis not present

## 2017-06-26 DIAGNOSIS — H2513 Age-related nuclear cataract, bilateral: Secondary | ICD-10-CM | POA: Diagnosis not present

## 2017-07-27 DIAGNOSIS — B078 Other viral warts: Secondary | ICD-10-CM | POA: Diagnosis not present

## 2017-07-27 DIAGNOSIS — L821 Other seborrheic keratosis: Secondary | ICD-10-CM | POA: Diagnosis not present

## 2017-07-27 DIAGNOSIS — D485 Neoplasm of uncertain behavior of skin: Secondary | ICD-10-CM | POA: Diagnosis not present

## 2017-07-27 DIAGNOSIS — L570 Actinic keratosis: Secondary | ICD-10-CM | POA: Diagnosis not present

## 2017-10-27 DIAGNOSIS — E785 Hyperlipidemia, unspecified: Secondary | ICD-10-CM | POA: Diagnosis not present

## 2017-10-27 DIAGNOSIS — G4700 Insomnia, unspecified: Secondary | ICD-10-CM | POA: Diagnosis not present

## 2017-10-27 DIAGNOSIS — Z23 Encounter for immunization: Secondary | ICD-10-CM | POA: Diagnosis not present

## 2017-10-27 DIAGNOSIS — I10 Essential (primary) hypertension: Secondary | ICD-10-CM | POA: Diagnosis not present

## 2017-10-27 DIAGNOSIS — R7303 Prediabetes: Secondary | ICD-10-CM | POA: Diagnosis not present

## 2017-11-24 DIAGNOSIS — Z23 Encounter for immunization: Secondary | ICD-10-CM | POA: Diagnosis not present

## 2018-01-26 DIAGNOSIS — Z23 Encounter for immunization: Secondary | ICD-10-CM | POA: Diagnosis not present

## 2018-02-12 DIAGNOSIS — R972 Elevated prostate specific antigen [PSA]: Secondary | ICD-10-CM | POA: Diagnosis not present

## 2018-02-19 DIAGNOSIS — N401 Enlarged prostate with lower urinary tract symptoms: Secondary | ICD-10-CM | POA: Diagnosis not present

## 2018-02-19 DIAGNOSIS — R351 Nocturia: Secondary | ICD-10-CM | POA: Diagnosis not present

## 2018-02-19 DIAGNOSIS — R972 Elevated prostate specific antigen [PSA]: Secondary | ICD-10-CM | POA: Diagnosis not present

## 2018-06-07 DIAGNOSIS — E785 Hyperlipidemia, unspecified: Secondary | ICD-10-CM | POA: Diagnosis not present

## 2018-06-07 DIAGNOSIS — K219 Gastro-esophageal reflux disease without esophagitis: Secondary | ICD-10-CM | POA: Diagnosis not present

## 2018-06-07 DIAGNOSIS — R7303 Prediabetes: Secondary | ICD-10-CM | POA: Diagnosis not present

## 2018-06-07 DIAGNOSIS — G4700 Insomnia, unspecified: Secondary | ICD-10-CM | POA: Diagnosis not present

## 2018-06-07 DIAGNOSIS — I10 Essential (primary) hypertension: Secondary | ICD-10-CM | POA: Diagnosis not present

## 2018-06-07 DIAGNOSIS — Z1389 Encounter for screening for other disorder: Secondary | ICD-10-CM | POA: Diagnosis not present

## 2018-06-07 DIAGNOSIS — Z0000 Encounter for general adult medical examination without abnormal findings: Secondary | ICD-10-CM | POA: Diagnosis not present

## 2018-06-07 DIAGNOSIS — N400 Enlarged prostate without lower urinary tract symptoms: Secondary | ICD-10-CM | POA: Diagnosis not present

## 2018-09-12 DIAGNOSIS — D1801 Hemangioma of skin and subcutaneous tissue: Secondary | ICD-10-CM | POA: Diagnosis not present

## 2018-09-12 DIAGNOSIS — L723 Sebaceous cyst: Secondary | ICD-10-CM | POA: Diagnosis not present

## 2018-09-12 DIAGNOSIS — L820 Inflamed seborrheic keratosis: Secondary | ICD-10-CM | POA: Diagnosis not present

## 2018-09-12 DIAGNOSIS — L821 Other seborrheic keratosis: Secondary | ICD-10-CM | POA: Diagnosis not present

## 2018-09-12 DIAGNOSIS — L814 Other melanin hyperpigmentation: Secondary | ICD-10-CM | POA: Diagnosis not present

## 2018-09-12 DIAGNOSIS — L570 Actinic keratosis: Secondary | ICD-10-CM | POA: Diagnosis not present

## 2018-09-12 DIAGNOSIS — D225 Melanocytic nevi of trunk: Secondary | ICD-10-CM | POA: Diagnosis not present

## 2018-11-16 DIAGNOSIS — Z23 Encounter for immunization: Secondary | ICD-10-CM | POA: Diagnosis not present

## 2018-12-10 DIAGNOSIS — I10 Essential (primary) hypertension: Secondary | ICD-10-CM | POA: Diagnosis not present

## 2018-12-10 DIAGNOSIS — G4700 Insomnia, unspecified: Secondary | ICD-10-CM | POA: Diagnosis not present

## 2018-12-10 DIAGNOSIS — N400 Enlarged prostate without lower urinary tract symptoms: Secondary | ICD-10-CM | POA: Diagnosis not present

## 2018-12-10 DIAGNOSIS — R7303 Prediabetes: Secondary | ICD-10-CM | POA: Diagnosis not present

## 2018-12-10 DIAGNOSIS — E785 Hyperlipidemia, unspecified: Secondary | ICD-10-CM | POA: Diagnosis not present

## 2019-02-08 DIAGNOSIS — R972 Elevated prostate specific antigen [PSA]: Secondary | ICD-10-CM | POA: Diagnosis not present

## 2019-02-21 DIAGNOSIS — R972 Elevated prostate specific antigen [PSA]: Secondary | ICD-10-CM | POA: Diagnosis not present

## 2019-02-21 DIAGNOSIS — N401 Enlarged prostate with lower urinary tract symptoms: Secondary | ICD-10-CM | POA: Diagnosis not present

## 2019-02-21 DIAGNOSIS — R351 Nocturia: Secondary | ICD-10-CM | POA: Diagnosis not present

## 2019-02-25 DIAGNOSIS — M179 Osteoarthritis of knee, unspecified: Secondary | ICD-10-CM | POA: Diagnosis not present

## 2019-02-25 DIAGNOSIS — M170 Bilateral primary osteoarthritis of knee: Secondary | ICD-10-CM | POA: Diagnosis not present

## 2019-02-25 DIAGNOSIS — M25561 Pain in right knee: Secondary | ICD-10-CM | POA: Diagnosis not present

## 2019-02-25 DIAGNOSIS — M25562 Pain in left knee: Secondary | ICD-10-CM | POA: Diagnosis not present

## 2019-03-29 DIAGNOSIS — M1711 Unilateral primary osteoarthritis, right knee: Secondary | ICD-10-CM | POA: Diagnosis not present

## 2019-03-29 DIAGNOSIS — M1712 Unilateral primary osteoarthritis, left knee: Secondary | ICD-10-CM | POA: Diagnosis not present

## 2019-03-29 DIAGNOSIS — M170 Bilateral primary osteoarthritis of knee: Secondary | ICD-10-CM | POA: Diagnosis not present

## 2019-04-05 DIAGNOSIS — M1711 Unilateral primary osteoarthritis, right knee: Secondary | ICD-10-CM | POA: Diagnosis not present

## 2019-04-05 DIAGNOSIS — M25562 Pain in left knee: Secondary | ICD-10-CM | POA: Diagnosis not present

## 2019-04-05 DIAGNOSIS — M170 Bilateral primary osteoarthritis of knee: Secondary | ICD-10-CM | POA: Diagnosis not present

## 2019-04-05 DIAGNOSIS — M25561 Pain in right knee: Secondary | ICD-10-CM | POA: Diagnosis not present

## 2019-04-05 DIAGNOSIS — M1712 Unilateral primary osteoarthritis, left knee: Secondary | ICD-10-CM | POA: Diagnosis not present

## 2019-04-12 DIAGNOSIS — M25562 Pain in left knee: Secondary | ICD-10-CM | POA: Diagnosis not present

## 2019-04-12 DIAGNOSIS — M170 Bilateral primary osteoarthritis of knee: Secondary | ICD-10-CM | POA: Diagnosis not present

## 2019-04-12 DIAGNOSIS — M25561 Pain in right knee: Secondary | ICD-10-CM | POA: Diagnosis not present

## 2019-06-28 DIAGNOSIS — Z1159 Encounter for screening for other viral diseases: Secondary | ICD-10-CM | POA: Diagnosis not present

## 2019-06-28 DIAGNOSIS — N400 Enlarged prostate without lower urinary tract symptoms: Secondary | ICD-10-CM | POA: Diagnosis not present

## 2019-06-28 DIAGNOSIS — R7303 Prediabetes: Secondary | ICD-10-CM | POA: Diagnosis not present

## 2019-06-28 DIAGNOSIS — Z0000 Encounter for general adult medical examination without abnormal findings: Secondary | ICD-10-CM | POA: Diagnosis not present

## 2019-06-28 DIAGNOSIS — G4700 Insomnia, unspecified: Secondary | ICD-10-CM | POA: Diagnosis not present

## 2019-06-28 DIAGNOSIS — E785 Hyperlipidemia, unspecified: Secondary | ICD-10-CM | POA: Diagnosis not present

## 2019-06-28 DIAGNOSIS — I10 Essential (primary) hypertension: Secondary | ICD-10-CM | POA: Diagnosis not present

## 2019-06-28 DIAGNOSIS — K219 Gastro-esophageal reflux disease without esophagitis: Secondary | ICD-10-CM | POA: Diagnosis not present

## 2019-10-30 DIAGNOSIS — R4020 Unspecified coma: Secondary | ICD-10-CM | POA: Diagnosis not present

## 2019-10-30 DIAGNOSIS — I959 Hypotension, unspecified: Secondary | ICD-10-CM | POA: Diagnosis not present

## 2019-10-30 DIAGNOSIS — R61 Generalized hyperhidrosis: Secondary | ICD-10-CM | POA: Diagnosis not present

## 2019-10-30 DIAGNOSIS — R531 Weakness: Secondary | ICD-10-CM | POA: Diagnosis not present

## 2019-10-30 DIAGNOSIS — R0902 Hypoxemia: Secondary | ICD-10-CM | POA: Diagnosis not present

## 2019-11-07 DIAGNOSIS — L720 Epidermal cyst: Secondary | ICD-10-CM | POA: Diagnosis not present

## 2019-11-07 DIAGNOSIS — L723 Sebaceous cyst: Secondary | ICD-10-CM | POA: Diagnosis not present

## 2019-11-21 DIAGNOSIS — Z4802 Encounter for removal of sutures: Secondary | ICD-10-CM | POA: Diagnosis not present

## 2019-12-04 DIAGNOSIS — Z23 Encounter for immunization: Secondary | ICD-10-CM | POA: Diagnosis not present

## 2019-12-11 DIAGNOSIS — M25562 Pain in left knee: Secondary | ICD-10-CM | POA: Diagnosis not present

## 2019-12-11 DIAGNOSIS — M25561 Pain in right knee: Secondary | ICD-10-CM | POA: Diagnosis not present

## 2019-12-11 DIAGNOSIS — M170 Bilateral primary osteoarthritis of knee: Secondary | ICD-10-CM | POA: Diagnosis not present

## 2019-12-24 ENCOUNTER — Ambulatory Visit: Payer: Self-pay | Attending: Internal Medicine

## 2019-12-24 DIAGNOSIS — Z23 Encounter for immunization: Secondary | ICD-10-CM

## 2019-12-26 DIAGNOSIS — I10 Essential (primary) hypertension: Secondary | ICD-10-CM | POA: Diagnosis not present

## 2019-12-26 DIAGNOSIS — E6609 Other obesity due to excess calories: Secondary | ICD-10-CM | POA: Diagnosis not present

## 2019-12-26 DIAGNOSIS — R7303 Prediabetes: Secondary | ICD-10-CM | POA: Diagnosis not present

## 2019-12-26 DIAGNOSIS — E785 Hyperlipidemia, unspecified: Secondary | ICD-10-CM | POA: Diagnosis not present

## 2019-12-26 DIAGNOSIS — G4700 Insomnia, unspecified: Secondary | ICD-10-CM | POA: Diagnosis not present

## 2020-02-26 DIAGNOSIS — R972 Elevated prostate specific antigen [PSA]: Secondary | ICD-10-CM | POA: Diagnosis not present

## 2020-03-04 DIAGNOSIS — R350 Frequency of micturition: Secondary | ICD-10-CM | POA: Diagnosis not present

## 2020-03-04 DIAGNOSIS — R972 Elevated prostate specific antigen [PSA]: Secondary | ICD-10-CM | POA: Diagnosis not present

## 2020-03-04 DIAGNOSIS — R351 Nocturia: Secondary | ICD-10-CM | POA: Diagnosis not present

## 2020-03-04 DIAGNOSIS — N401 Enlarged prostate with lower urinary tract symptoms: Secondary | ICD-10-CM | POA: Diagnosis not present

## 2020-05-27 DIAGNOSIS — Z86010 Personal history of colonic polyps: Secondary | ICD-10-CM | POA: Diagnosis not present

## 2020-06-05 DIAGNOSIS — Z1211 Encounter for screening for malignant neoplasm of colon: Secondary | ICD-10-CM | POA: Diagnosis not present

## 2020-06-05 DIAGNOSIS — Z01818 Encounter for other preprocedural examination: Secondary | ICD-10-CM | POA: Diagnosis not present

## 2020-06-05 DIAGNOSIS — Z86010 Personal history of colonic polyps: Secondary | ICD-10-CM | POA: Diagnosis not present

## 2020-07-07 DIAGNOSIS — E669 Obesity, unspecified: Secondary | ICD-10-CM | POA: Diagnosis not present

## 2020-07-07 DIAGNOSIS — I10 Essential (primary) hypertension: Secondary | ICD-10-CM | POA: Diagnosis not present

## 2020-07-07 DIAGNOSIS — K219 Gastro-esophageal reflux disease without esophagitis: Secondary | ICD-10-CM | POA: Diagnosis not present

## 2020-07-07 DIAGNOSIS — Z0000 Encounter for general adult medical examination without abnormal findings: Secondary | ICD-10-CM | POA: Diagnosis not present

## 2020-07-07 DIAGNOSIS — G4700 Insomnia, unspecified: Secondary | ICD-10-CM | POA: Diagnosis not present

## 2020-07-07 DIAGNOSIS — M179 Osteoarthritis of knee, unspecified: Secondary | ICD-10-CM | POA: Diagnosis not present

## 2020-07-07 DIAGNOSIS — E785 Hyperlipidemia, unspecified: Secondary | ICD-10-CM | POA: Diagnosis not present

## 2020-07-07 DIAGNOSIS — Z79899 Other long term (current) drug therapy: Secondary | ICD-10-CM | POA: Diagnosis not present

## 2020-07-07 DIAGNOSIS — R7303 Prediabetes: Secondary | ICD-10-CM | POA: Diagnosis not present

## 2020-09-17 DIAGNOSIS — L821 Other seborrheic keratosis: Secondary | ICD-10-CM | POA: Diagnosis not present

## 2020-09-17 DIAGNOSIS — D1801 Hemangioma of skin and subcutaneous tissue: Secondary | ICD-10-CM | POA: Diagnosis not present

## 2020-09-17 DIAGNOSIS — L720 Epidermal cyst: Secondary | ICD-10-CM | POA: Diagnosis not present

## 2020-09-17 DIAGNOSIS — L570 Actinic keratosis: Secondary | ICD-10-CM | POA: Diagnosis not present

## 2020-09-17 DIAGNOSIS — D225 Melanocytic nevi of trunk: Secondary | ICD-10-CM | POA: Diagnosis not present

## 2020-09-17 DIAGNOSIS — Z85828 Personal history of other malignant neoplasm of skin: Secondary | ICD-10-CM | POA: Diagnosis not present

## 2020-12-02 DIAGNOSIS — Z23 Encounter for immunization: Secondary | ICD-10-CM | POA: Diagnosis not present

## 2021-01-06 DIAGNOSIS — I10 Essential (primary) hypertension: Secondary | ICD-10-CM | POA: Diagnosis not present

## 2021-01-06 DIAGNOSIS — E785 Hyperlipidemia, unspecified: Secondary | ICD-10-CM | POA: Diagnosis not present

## 2021-01-06 DIAGNOSIS — R7303 Prediabetes: Secondary | ICD-10-CM | POA: Diagnosis not present

## 2021-01-06 DIAGNOSIS — G4700 Insomnia, unspecified: Secondary | ICD-10-CM | POA: Diagnosis not present

## 2021-02-03 DIAGNOSIS — C4402 Squamous cell carcinoma of skin of lip: Secondary | ICD-10-CM | POA: Diagnosis not present

## 2021-02-03 DIAGNOSIS — D485 Neoplasm of uncertain behavior of skin: Secondary | ICD-10-CM | POA: Diagnosis not present

## 2021-02-10 DIAGNOSIS — Z85828 Personal history of other malignant neoplasm of skin: Secondary | ICD-10-CM | POA: Diagnosis not present

## 2021-02-10 DIAGNOSIS — C4402 Squamous cell carcinoma of skin of lip: Secondary | ICD-10-CM | POA: Diagnosis not present

## 2021-02-22 DIAGNOSIS — R972 Elevated prostate specific antigen [PSA]: Secondary | ICD-10-CM | POA: Diagnosis not present

## 2021-03-01 DIAGNOSIS — N401 Enlarged prostate with lower urinary tract symptoms: Secondary | ICD-10-CM | POA: Diagnosis not present

## 2021-03-01 DIAGNOSIS — R972 Elevated prostate specific antigen [PSA]: Secondary | ICD-10-CM | POA: Diagnosis not present

## 2021-03-01 DIAGNOSIS — R350 Frequency of micturition: Secondary | ICD-10-CM | POA: Diagnosis not present

## 2021-05-04 DIAGNOSIS — J40 Bronchitis, not specified as acute or chronic: Secondary | ICD-10-CM | POA: Diagnosis not present

## 2021-07-13 DIAGNOSIS — G4700 Insomnia, unspecified: Secondary | ICD-10-CM | POA: Diagnosis not present

## 2021-07-13 DIAGNOSIS — K219 Gastro-esophageal reflux disease without esophagitis: Secondary | ICD-10-CM | POA: Diagnosis not present

## 2021-07-13 DIAGNOSIS — E785 Hyperlipidemia, unspecified: Secondary | ICD-10-CM | POA: Diagnosis not present

## 2021-07-13 DIAGNOSIS — R7303 Prediabetes: Secondary | ICD-10-CM | POA: Diagnosis not present

## 2021-07-13 DIAGNOSIS — N400 Enlarged prostate without lower urinary tract symptoms: Secondary | ICD-10-CM | POA: Diagnosis not present

## 2021-07-13 DIAGNOSIS — I10 Essential (primary) hypertension: Secondary | ICD-10-CM | POA: Diagnosis not present

## 2021-07-13 DIAGNOSIS — Z0000 Encounter for general adult medical examination without abnormal findings: Secondary | ICD-10-CM | POA: Diagnosis not present

## 2021-07-13 DIAGNOSIS — M179 Osteoarthritis of knee, unspecified: Secondary | ICD-10-CM | POA: Diagnosis not present

## 2021-07-13 DIAGNOSIS — H6121 Impacted cerumen, right ear: Secondary | ICD-10-CM | POA: Diagnosis not present

## 2021-10-04 DIAGNOSIS — L821 Other seborrheic keratosis: Secondary | ICD-10-CM | POA: Diagnosis not present

## 2021-10-04 DIAGNOSIS — L570 Actinic keratosis: Secondary | ICD-10-CM | POA: Diagnosis not present

## 2021-10-04 DIAGNOSIS — D225 Melanocytic nevi of trunk: Secondary | ICD-10-CM | POA: Diagnosis not present

## 2021-10-04 DIAGNOSIS — D1801 Hemangioma of skin and subcutaneous tissue: Secondary | ICD-10-CM | POA: Diagnosis not present

## 2021-10-04 DIAGNOSIS — Z85828 Personal history of other malignant neoplasm of skin: Secondary | ICD-10-CM | POA: Diagnosis not present

## 2021-10-04 DIAGNOSIS — L820 Inflamed seborrheic keratosis: Secondary | ICD-10-CM | POA: Diagnosis not present

## 2021-10-04 DIAGNOSIS — L720 Epidermal cyst: Secondary | ICD-10-CM | POA: Diagnosis not present

## 2021-10-06 DIAGNOSIS — H5203 Hypermetropia, bilateral: Secondary | ICD-10-CM | POA: Diagnosis not present

## 2021-10-06 DIAGNOSIS — H0288B Meibomian gland dysfunction left eye, upper and lower eyelids: Secondary | ICD-10-CM | POA: Diagnosis not present

## 2021-10-06 DIAGNOSIS — H52223 Regular astigmatism, bilateral: Secondary | ICD-10-CM | POA: Diagnosis not present

## 2021-10-06 DIAGNOSIS — H2513 Age-related nuclear cataract, bilateral: Secondary | ICD-10-CM | POA: Diagnosis not present

## 2021-10-06 DIAGNOSIS — H35363 Drusen (degenerative) of macula, bilateral: Secondary | ICD-10-CM | POA: Diagnosis not present

## 2021-10-06 DIAGNOSIS — H0288A Meibomian gland dysfunction right eye, upper and lower eyelids: Secondary | ICD-10-CM | POA: Diagnosis not present

## 2021-10-06 DIAGNOSIS — H524 Presbyopia: Secondary | ICD-10-CM | POA: Diagnosis not present

## 2022-01-11 DIAGNOSIS — E785 Hyperlipidemia, unspecified: Secondary | ICD-10-CM | POA: Diagnosis not present

## 2022-01-11 DIAGNOSIS — R7303 Prediabetes: Secondary | ICD-10-CM | POA: Diagnosis not present

## 2022-01-11 DIAGNOSIS — I10 Essential (primary) hypertension: Secondary | ICD-10-CM | POA: Diagnosis not present

## 2022-01-11 DIAGNOSIS — G4700 Insomnia, unspecified: Secondary | ICD-10-CM | POA: Diagnosis not present

## 2022-01-11 DIAGNOSIS — E669 Obesity, unspecified: Secondary | ICD-10-CM | POA: Diagnosis not present

## 2022-01-11 DIAGNOSIS — K219 Gastro-esophageal reflux disease without esophagitis: Secondary | ICD-10-CM | POA: Diagnosis not present

## 2022-01-11 DIAGNOSIS — N400 Enlarged prostate without lower urinary tract symptoms: Secondary | ICD-10-CM | POA: Diagnosis not present

## 2022-01-11 DIAGNOSIS — Z79899 Other long term (current) drug therapy: Secondary | ICD-10-CM | POA: Diagnosis not present

## 2022-03-02 DIAGNOSIS — R3915 Urgency of urination: Secondary | ICD-10-CM | POA: Diagnosis not present

## 2022-03-02 DIAGNOSIS — R351 Nocturia: Secondary | ICD-10-CM | POA: Diagnosis not present

## 2022-03-02 DIAGNOSIS — N401 Enlarged prostate with lower urinary tract symptoms: Secondary | ICD-10-CM | POA: Diagnosis not present

## 2022-03-02 DIAGNOSIS — R350 Frequency of micturition: Secondary | ICD-10-CM | POA: Diagnosis not present

## 2022-03-17 DIAGNOSIS — M25512 Pain in left shoulder: Secondary | ICD-10-CM | POA: Diagnosis not present

## 2022-07-06 DIAGNOSIS — M170 Bilateral primary osteoarthritis of knee: Secondary | ICD-10-CM | POA: Diagnosis not present

## 2022-07-25 DIAGNOSIS — K219 Gastro-esophageal reflux disease without esophagitis: Secondary | ICD-10-CM | POA: Diagnosis not present

## 2022-07-25 DIAGNOSIS — Z0000 Encounter for general adult medical examination without abnormal findings: Secondary | ICD-10-CM | POA: Diagnosis not present

## 2022-07-25 DIAGNOSIS — E669 Obesity, unspecified: Secondary | ICD-10-CM | POA: Diagnosis not present

## 2022-07-25 DIAGNOSIS — R7303 Prediabetes: Secondary | ICD-10-CM | POA: Diagnosis not present

## 2022-07-25 DIAGNOSIS — G4700 Insomnia, unspecified: Secondary | ICD-10-CM | POA: Diagnosis not present

## 2022-07-25 DIAGNOSIS — Z79899 Other long term (current) drug therapy: Secondary | ICD-10-CM | POA: Diagnosis not present

## 2022-07-25 DIAGNOSIS — E785 Hyperlipidemia, unspecified: Secondary | ICD-10-CM | POA: Diagnosis not present

## 2022-07-25 DIAGNOSIS — I10 Essential (primary) hypertension: Secondary | ICD-10-CM | POA: Diagnosis not present

## 2022-07-25 DIAGNOSIS — N400 Enlarged prostate without lower urinary tract symptoms: Secondary | ICD-10-CM | POA: Diagnosis not present

## 2022-07-25 DIAGNOSIS — M170 Bilateral primary osteoarthritis of knee: Secondary | ICD-10-CM | POA: Diagnosis not present

## 2022-07-26 DIAGNOSIS — M25562 Pain in left knee: Secondary | ICD-10-CM | POA: Diagnosis not present

## 2022-07-26 DIAGNOSIS — M25561 Pain in right knee: Secondary | ICD-10-CM | POA: Diagnosis not present

## 2022-08-11 ENCOUNTER — Ambulatory Visit: Payer: Self-pay | Admitting: Orthopedic Surgery

## 2022-08-31 DIAGNOSIS — M1711 Unilateral primary osteoarthritis, right knee: Secondary | ICD-10-CM | POA: Diagnosis not present

## 2022-10-03 NOTE — Progress Notes (Addendum)
COVID Vaccine received:  []  No [x]  Yes Date of any COVID positive Test in last 90 days: No PCP - Laurann Montana NP Cardiologist - no  Medical clearance 07/26/22 Dr. Laurann Montana  Chest x-ray -  EKG -  10/04/22  EPIC Stress Test - .10years ago ECHO -  Cardiac Cath - no  Bowel Prep - [x]  No  []   Yes ______  Pacemaker / ICD device [x]  No []  Yes   Spinal Cord Stimulator:[x]  No []  Yes       History of Sleep Apnea? [x]  No []  Yes   CPAP used?- [x]  No []  Yes    Does the patient monitor blood sugar?          [x]  No []  Yes  []  N/A  Patient has: [x]  NO Hx DM   []  Pre-DM                 []  DM1  []   DM2 Does patient have a [employer] or Dexacom? [x]  No []  Yes   Fasting Blood Sugar Ranges- N/A  Checks Blood Sugar _____ times a day N/A  GLP1 agonist / usual dose - No GLP1 instructions:  SGLT-2 inhibitors / usual dose - No SGLT-2 instructions:   Blood Thinner / Instructions:No Aspirin Instructions:No  Comments:   Activity level: Patient is able to climb a flight of stairs without difficulty; [x]  No CP  [x]  No SOB,  Patient can  perform ADLs without assistance.   Anesthesia review:   Patient denies shortness of breath, fever, cough and chest pain at PAT appointment.  Patient verbalized understanding and agreement to the Pre-Surgical Instructions that were given to them at this PAT appointment. Patient was also educated of the need to review these PAT instructions again prior to his/her surgery.I reviewed the appropriate phone numbers to call if they have any and questions or concerns.

## 2022-10-04 ENCOUNTER — Other Ambulatory Visit: Payer: Self-pay

## 2022-10-04 ENCOUNTER — Encounter (HOSPITAL_COMMUNITY): Payer: Self-pay

## 2022-10-04 ENCOUNTER — Encounter (HOSPITAL_COMMUNITY)
Admission: RE | Admit: 2022-10-04 | Discharge: 2022-10-04 | Disposition: A | Discharge status: Home / Self Care | Payer: Medicare Other | Source: Ambulatory Visit | Attending: Specialist | Admitting: Specialist

## 2022-10-04 VITALS — BP 154/75 | HR 65 | Temp 98.0°F | Resp 18 | Ht 64.0 in | Wt 206.0 lb

## 2022-10-04 DIAGNOSIS — Z01818 Encounter for other preprocedural examination: Secondary | ICD-10-CM | POA: Insufficient documentation

## 2022-10-04 HISTORY — DX: Malignant (primary) neoplasm, unspecified: C80.1

## 2022-10-04 HISTORY — DX: Essential (primary) hypertension: I10

## 2022-10-04 HISTORY — DX: Unspecified osteoarthritis, unspecified site: M19.90

## 2022-10-04 LAB — BASIC METABOLIC PANEL
Anion gap: 6 (ref 5–15)
BUN: 14 mg/dL (ref 8–23)
CO2: 26 mmol/L (ref 22–32)
Calcium: 9.1 mg/dL (ref 8.9–10.3)
Chloride: 107 mmol/L (ref 98–111)
Creatinine, Ser: 0.75 mg/dL (ref 0.61–1.24)
GFR, Estimated: >60 mL/min (ref >60)
Glucose, Bld: 112 mg/dL — ABNORMAL HIGH (ref 70–99)
Potassium: 4 mmol/L (ref 3.5–5.1)
Sodium: 139 mmol/L (ref 135–145)

## 2022-10-04 LAB — SURGICAL PCR SCREEN
MRSA, PCR: NEGATIVE
Staphylococcus aureus: NEGATIVE

## 2022-10-04 LAB — CBC
HCT: 44 % (ref 39.0–52.0)
Hemoglobin: 14.5 g/dL (ref 13.0–17.0)
MCH: 31.3 pg (ref 26.0–34.0)
MCHC: 33 g/dL (ref 30.0–36.0)
MCV: 94.8 fL (ref 80.0–100.0)
Platelets: 274 10*3/uL (ref 150–400)
RBC: 4.64 MIL/uL (ref 4.22–5.81)
RDW: 13.4 % (ref 11.5–15.5)
WBC: 6.3 10*3/uL (ref 4.0–10.5)
nRBC: 0 % (ref 0.0–0.2)

## 2022-10-04 NOTE — Patient Instructions (Signed)
SURGICAL WAITING ROOM VISITATION  Patients having surgery or a procedure may have no more than 2 support people in the waiting area - these visitors may rotate.    Children under the age of 41 must have an adult with them who is not the patient.  Due to an increase in RSV and influenza rates and associated hospitalizations, children ages 17 and under may not visit patients in [facility] hospitals.  If the patient needs to stay at the hospital during part of their recovery, the visitor guidelines for inpatient rooms apply. Pre-op nurse will coordinate an appropriate time for 1 support person to accompany patient in pre-op.  This support person may not rotate.    Please refer to the [facility] website for the visitor guidelines for Inpatients (after your surgery is over and you are in a regular room).       Your procedure is scheduled on: 10/12/22   Report to [facility] Main Entrance    Report to admitting at 5:15 AM   Call this number if you have problems the morning of surgery [phone]   Do not eat food :After Midnight.   After Midnight you may have the following liquids until 4;30 AM DAY OF SURGERY  Water Non-Citrus Juices (without pulp, NO RED-Apple, White grape, White cranberry) Black Coffee (NO MILK/CREAM OR CREAMERS, sugar ok)  Clear Tea (NO MILK/CREAM OR CREAMERS, sugar ok) regular and decaf                             Plain Jell-O (NO RED)                                           Fruit ices (not with fruit pulp, NO RED)                                     Popsicles (NO RED)                                                               Sports drinks like Gatorade (NO RED)                  The day of surgery:  Drink ONE (1) Pre-Surgery Clear Ensure  at4:30 AM the morning of surgery. Drink in one sitting. Do not sip.  This drink was given to you during your hospital  pre-op appointment visit. Nothing else to drink after completing the  Pre-Surgery Clear  Ensure or G2.     Oral Hygiene is also important to reduce your risk of infection.                                    Remember - BRUSH YOUR TEETH THE MORNING OF SURGERY WITH YOUR REGULAR TOOTHPASTE    Take these medicines the morning of surgery Alfuzosin, Amlodipine, Atorvastatin, Prilosec, Tramadol              You may not have  any metal on your body including hair pins, jewelry, and body piercing             Do not wear make-up, lotions, powders, cologne, or deodorant  Do not wear nail polish including gel and S&S, artificial/acrylic nails, or any other type of covering on natural nails including finger and toenails. If you have artificial nails, gel coating, etc. that needs to be removed by a nail salon please have this removed prior to surgery or surgery may need to be canceled/ delayed if the surgeon/ anesthesia feels like they are unable to be safely monitored.   Do not shave  48 hours prior to surgery.               Men may shave face and neck.   Do not bring valuables to the hospital. [facility] IS NOT             RESPONSIBLE   FOR VALUABLES.   Contacts, glasses, dentures or bridgework may not be worn into surgery.   Bring small overnight bag day of surgery.   DO NOT BRING YOUR HOME MEDICATIONS TO THE HOSPITAL. PHARMACY WILL DISPENSE MEDICATIONS LISTED ON YOUR MEDICATION LIST TO YOU DURING YOUR ADMISSION IN THE HOSPITAL!    Patients discharged on the day of surgery will not be allowed to drive home.  Someone NEEDS to stay with you for the first 24 hours after anesthesia.   Special Instructions: Bring a copy of your healthcare power of attorney and living will documents the day of surgery if you haven't scanned them before.              Please read over the following fact sheets you were given: IF YOU HAVE QUESTIONS ABOUT YOUR PRE-OP INSTRUCTIONS PLEASE CALL [phone]   If you received a COVID test during your pre-op visit  it is requested that you wear a mask when out  in public, stay away from anyone that may not be feeling well and notify your surgeon if you develop symptoms. If you test positive for Covid or have been in contact with anyone that has tested positive in the last 10 days please notify you surgeon.      Pre-operative 5 CHG Bath Instructions   You can play a key role in reducing the risk of infection after surgery. Your skin needs to be as free of germs as possible. You can reduce the number of germs on your skin by washing with CHG (chlorhexidine gluconate) soap before surgery. CHG is an antiseptic soap that kills germs and continues to kill germs even after washing.   DO NOT use if you have an allergy to chlorhexidine/CHG or antibacterial soaps. If your skin becomes reddened or irritated, stop using the CHG and notify one of our RNs at [phone].   Please shower with the CHG soap starting 4 days before surgery using the following schedule:     Please keep in mind the following:  DO NOT shave, including legs and underarms, starting the day of your first shower.   You may shave your face at any point before/day of surgery.  Place clean sheets on your bed the day you start using CHG soap. Use a clean washcloth (not used since being washed) for each shower. DO NOT sleep with pets once you start using the CHG.   CHG Shower Instructions:  If you choose to wash your hair and private area, wash first with your normal shampoo/soap.  After you use  shampoo/soap, rinse your hair and body thoroughly to remove shampoo/soap residue.  Turn the water OFF and apply about 3 tablespoons (45 ml) of CHG soap to a CLEAN washcloth.  Apply CHG soap ONLY FROM YOUR NECK DOWN TO YOUR TOES (washing for 3-5 minutes)  DO NOT use CHG soap on face, private areas, open wounds, or sores.  Pay special attention to the area where your surgery is being performed.  If you are having back surgery, having someone wash your back for you may be helpful. Wait 2 minutes after  CHG soap is applied, then you may rinse off the CHG soap.  Pat dry with a clean towel  Put on clean clothes/pajamas   If you choose to wear lotion, please use ONLY the CHG-compatible lotions on the back of this paper.     Additional instructions for the day of surgery: DO NOT APPLY any lotions, deodorants, cologne, or perfumes.   Put on clean/comfortable clothes.  Brush your teeth.  Ask your nurse before applying any prescription medications to the skin.      CHG Compatible Lotions   Aveeno Moisturizing lotion  Cetaphil Moisturizing Cream  Cetaphil Moisturizing Lotion  Clairol Herbal Essence Moisturizing Lotion, Dry Skin  Clairol Herbal Essence Moisturizing Lotion, Extra Dry Skin  Clairol Herbal Essence Moisturizing Lotion, Normal Skin  Curel Age Defying Therapeutic Moisturizing Lotion with Alpha Hydroxy  Curel Extreme Care Body Lotion  Curel Soothing Hands Moisturizing Hand Lotion  Curel Therapeutic Moisturizing Cream, Fragrance-Free  Curel Therapeutic Moisturizing Lotion, Fragrance-Free  Curel Therapeutic Moisturizing Lotion, Original Formula  Eucerin Daily Replenishing Lotion  Eucerin Dry Skin Therapy Plus Alpha Hydroxy Crme  Eucerin Dry Skin Therapy Plus Alpha Hydroxy Lotion  Eucerin Original Crme  Eucerin Original Lotion  Eucerin Plus Crme Eucerin Plus Lotion  Eucerin TriLipid Replenishing Lotion  Keri Anti-Bacterial Hand Lotion  Keri Deep Conditioning Original Lotion Dry Skin Formula Softly Scented  Keri Deep Conditioning Original Lotion, Fragrance Free Sensitive Skin Formula  Keri Lotion Fast Absorbing Fragrance Free Sensitive Skin Formula  Keri Lotion Fast Absorbing Softly Scented Dry Skin Formula  Keri Original Lotion  Keri Skin Renewal Lotion Keri Silky Smooth Lotion  Keri Silky Smooth Sensitive Skin Lotion  Nivea Body Creamy Conditioning Oil  Nivea Body Extra Enriched Lotion  Nivea Body Original Lotion  Nivea Body Sheer Moisturizing Lotion Nivea Crme   Nivea Skin Firming Lotion  NutraDerm 30 Skin Lotion  NutraDerm Skin Lotion  NutraDerm Therapeutic Skin Cream  NutraDerm Therapeutic Skin Lotion  ProShield Protective Hand Cream  Provon moisturizing lotion Incentive Spirometer (Watch this video at home: [email or web address])  An incentive spirometer is a tool that can help keep your lungs clear and active. This tool measures how well you are filling your lungs with each breath. Taking long deep breaths may help reverse or decrease the chance of developing breathing (pulmonary) problems (especially infection) following: A long period of time when you are unable to move or be active. BEFORE THE PROCEDURE  If the spirometer includes an indicator to show your best effort, your nurse or respiratory therapist will set it to a desired goal. If possible, sit up straight or lean slightly forward. Try not to slouch. Hold the incentive spirometer in an upright position. INSTRUCTIONS FOR USE  Sit on the edge of your bed if possible, or sit up as far as you can in bed or on a chair. Hold the incentive spirometer in an upright position. Breathe out normally. Place the mouthpiece in your mouth  and seal your lips tightly around it. Breathe in slowly and as deeply as possible, raising the piston or the ball toward the top of the column. Hold your breath for 3-5 seconds or for as long as possible. Allow the piston or ball to fall to the bottom of the column. Remove the mouthpiece from your mouth and breathe out normally. Rest for a few seconds and repeat Steps 1 through 7 at least 10 times every 1-2 hours when you are awake. Take your time and take a few normal breaths between deep breaths. The spirometer may include an indicator to show your best effort. Use the indicator as a goal to work toward during each repetition. After each set of 10 deep breaths, practice coughing to be sure your lungs are clear. If you have an incision (the cut  made at the time of surgery), support your incision when coughing by placing a pillow or rolled up towels firmly against it. Once you are able to get out of bed, walk around indoors and cough well. You may stop using the incentive spirometer when instructed by your caregiver.  RISKS AND COMPLICATIONS Take your time so you do not get dizzy or light-headed. If you are in pain, you may need to take or ask for pain medication before doing incentive spirometry. It is harder to take a deep breath if you are having pain. AFTER USE Rest and breathe slowly and easily. It can be helpful to keep track of a log of your progress. Your caregiver can provide you with a simple table to help with this. If you are using the spirometer at home, follow these instructions: SEEK MEDICAL CARE IF:  You are having difficultly using the spirometer. You have trouble using the spirometer as often as instructed. Your pain medication is not giving enough relief while using the spirometer. You develop fever of 100.5 F (38.1 C) or higher. SEEK IMMEDIATE MEDICAL CARE IF:  You cough up bloody sputum that had not been present before. You develop fever of 102 F (38.9 C) or greater. You develop worsening pain at or near the incision site. MAKE SURE YOU:  Understand these instructions. Will watch your condition. Will get help right away if you are not doing well or get worse. Document Released: 07/18/2006 Document Revised: 05/30/2011 Document Reviewed: 09/18/2006 [facility] Patient Information 2014 [place], [place].

## 2022-10-05 ENCOUNTER — Ambulatory Visit: Payer: Self-pay | Admitting: Orthopedic Surgery

## 2022-10-05 DIAGNOSIS — Z85828 Personal history of other malignant neoplasm of skin: Secondary | ICD-10-CM | POA: Diagnosis not present

## 2022-10-05 DIAGNOSIS — L570 Actinic keratosis: Secondary | ICD-10-CM | POA: Diagnosis not present

## 2022-10-05 DIAGNOSIS — L821 Other seborrheic keratosis: Secondary | ICD-10-CM | POA: Diagnosis not present

## 2022-10-05 DIAGNOSIS — D225 Melanocytic nevi of trunk: Secondary | ICD-10-CM | POA: Diagnosis not present

## 2022-10-05 DIAGNOSIS — B078 Other viral warts: Secondary | ICD-10-CM | POA: Diagnosis not present

## 2022-10-05 NOTE — H&P (View-Only) (Signed)
Curtis Davis is an 77 y.o. male.   Chief Complaint: right knee pain HPI: Patient comes in for his H&P. Patient is scheduled for a right total knee replacement by Dr. Shelle Iron on 10/12/22 at [facility].  Dr. Shelle Iron and the patient mutually agreed to proceed with a total knee replacement. Risks and benefits of the procedure were discussed including stiffness, suboptimal range of motion, persistent pain, infection requiring removal of prosthesis and reinsertion, need for prophylactic antibiotics in the future, for example, dental procedures, possible need for manipulation, revision in the future and also anesthetic complications including DVT, PE, etc. We discussed the perioperative course, time in the hospital, postoperative recovery and the need for elevation to control swelling. We also discussed the predicted range of motion and the probability that squatting and kneeling would be unobtainable in the future. In addition, postoperative anticoagulation was discussed. We have obtained preoperative medical clearance as necessary. Provided illustrated handout and discussed it in detail. They will enroll in the total joint replacement educational forum at the hospital.  His [facility] pre-op appt is scheduled for tomorrow. He is not currently on ASA but is on NSAIDs and has been instructed to hold them appropriately.  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 Social History:  reports that he has never smoked. He has never used smokeless tobacco. He reports that he does not currently use alcohol. He reports that he does not use drugs.  Allergies:  Allergies  Allergen Reactions   Penicillins Other (See Comments)    Aggravated poison ivy reaction    Current meds: alfuzosin ER 10 mg tablet,extended release 24  hr amLODIPine 5 mg tablet atorvastatin 20 mg tablet HYDROcodone 5 mg-acetaminophen 325 mg tablet lisinopriL 40 mg tablet lysine 500 mg tablet meloxicam multivitamin omeprazole turmeric 450 mg-turmeric root extract 50 mg capsule zolpidem 5 mg tablet   Results for orders placed or performed during the hospital encounter of 10/04/22 (from the past 48 hour(s))  Surgical pcr screen     Status: None   Collection Time: 10/04/22  7:59 AM   Specimen: Nasal Mucosa; Nasal Swab  Result Value Ref Range   MRSA, PCR NEGATIVE NEGATIVE   Staphylococcus aureus NEGATIVE NEGATIVE    Comment: (NOTE) The Xpert SA Assay (FDA approved for NASAL specimens in patients 63 years of age and older), is one component of a comprehensive surveillance program. It is not intended to diagnose infection nor to guide or monitor treatment. Performed at [facility], [address] [place], [place] [address]   Basic metabolic panel per protocol     Status: Abnormal   Collection Time: 10/04/22  8:38 AM  Result Value Ref Range   Sodium 139 135 - 145 mmol/L   Potassium 4.0 3.5 - 5.1 mmol/L   Chloride 107 98 - 111 mmol/L   CO2 26 22 - 32 mmol/L   Glucose, Bld 112 (H) 70 - 99 mg/dL    Comment: Glucose reference range applies only to samples taken after fasting for at least 8 hours.   BUN 14 8 - 23 mg/dL   Creatinine, Ser 3.08 0.61 - 1.24 mg/dL   Calcium 9.1 8.9 - 65.7 mg/dL   GFR, Estimated >84 >69 mL/min  Comment: (NOTE) Calculated using the CKD-EPI Creatinine Equation (2021)    Anion gap 6 5 - 15    Comment: Performed at [facility], [address] [place], [place] [address]  CBC per protocol     Status: None   Collection Time: 10/04/22  8:38 AM  Result Value Ref Range   WBC 6.3 4.0 - 10.5 K/uL   RBC 4.64 4.22 - 5.81 MIL/uL   Hemoglobin 14.5 13.0 - 17.0 g/dL   HCT 09.3 23.5 - 57.3 %   MCV 94.8 80.0 - 100.0 fL   MCH 31.3 26.0 - 34.0 pg   MCHC 33.0 30.0 - 36.0  g/dL   RDW 22.0 25.4 - 27.0 %   Platelets 274 150 - 400 K/uL   nRBC 0.0 0.0 - 0.2 %    Comment: Performed at [facility], [address] [place], [place] [address]   No results found.  Review of Systems  Constitutional: Negative.   HENT: Negative.    Eyes: Negative.   Respiratory: Negative.    Cardiovascular: Negative.   Gastrointestinal: Negative.   Endocrine: Negative.   Genitourinary: Negative.   Musculoskeletal:  Positive for arthralgias, gait problem, joint swelling and myalgias.  Allergic/Immunologic: Negative.   Psychiatric/Behavioral: Negative.      There were no vitals taken for this visit. Physical Exam Constitutional:      Appearance: Normal appearance.  HENT:     Head: Normocephalic and atraumatic.     Right Ear: External ear normal.     Left Ear: External ear normal.     Nose: Nose normal.     Mouth/Throat:     Pharynx: Oropharynx is clear.  Eyes:     Conjunctiva/sclera: Conjunctivae normal.  Cardiovascular:     Rate and Rhythm: Normal rate and regular rhythm.     Pulses: Normal pulses.     Heart sounds: Normal heart sounds.  Pulmonary:     Effort: Pulmonary effort is normal.     Breath sounds: Normal breath sounds.  Abdominal:     General: Bowel sounds are normal.  Musculoskeletal:     Cervical back: Normal range of motion.     Comments: Patient is tender medial joint line bilaterally. Bilateral patellofemoral pain compression. Varus deformities are noted bilaterally. Walks with an antalgic gait. Mild effusions are noted bilaterally. He is ranges 0-1 40 left 0-1 30 right. No DVT ipsilateral hip and ankle exams unremarkable  Skin:    General: Skin is warm and dry.  Neurological:     Mental Status: He is alert.    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 Shelle Iron on 10/12/22. We again discussed the procedure itself as well as risks, complications and alternatives, including but not limited to DVT, PE, infx, bleeding, failure of procedure, need for secondary procedure including manipulation, nerve injury, ongoing pain/symptoms, anesthesia risk, even stroke or death. Also discussed typical post-op protocols, activity restrictions, need for PT, flexion/extension exercises, time out of work. Discussed need for DVT ppx post-op per protocol. Discussed dental ppx and infx prevention. Also discussed limitations post-operatively such as kneeling and squatting. All questions were answered. Patient desires to proceed with surgery as scheduled.  Will hold supplements, ASA and NSAIDs accordingly. Will remain NPO after midnight the night before  surgery. Will present to [facility] for pre-op testing. Anticipate hospital stay to include at least 2 midnights given medical history and to ensure proper pain control. Plan ASA for DVT ppx post-op. Plan Oxycodone, Colace, Miralax. Plan to d/c home post-op with family members at home for assistance and start outpt PT at our office immediately post-op. Will follow up 10-14 days post-op for suture removal and xrays.  Plan right total knee replacement  Dorothy Spark, PA-C for Dr Shelle Iron 10/05/2022, 2:10 PM

## 2022-10-05 NOTE — H&P (Signed)
Curtis Davis is an 77 y.o. male.   Chief Complaint: right knee pain HPI: Patient comes in for his H&P. Patient is scheduled for a right total knee replacement by Dr. Shelle Iron on 10/12/22 at [facility].  Dr. Shelle Iron and the patient mutually agreed to proceed with a total knee replacement. Risks and benefits of the procedure were discussed including stiffness, suboptimal range of motion, persistent pain, infection requiring removal of prosthesis and reinsertion, need for prophylactic antibiotics in the future, for example, dental procedures, possible need for manipulation, revision in the future and also anesthetic complications including DVT, PE, etc. We discussed the perioperative course, time in the hospital, postoperative recovery and the need for elevation to control swelling. We also discussed the predicted range of motion and the probability that squatting and kneeling would be unobtainable in the future. In addition, postoperative anticoagulation was discussed. We have obtained preoperative medical clearance as necessary. Provided illustrated handout and discussed it in detail. They will enroll in the total joint replacement educational forum at the hospital.  His [facility] pre-op appt is scheduled for tomorrow. He is not currently on ASA but is on NSAIDs and has been instructed to hold them appropriately.  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 Social History:  reports that he has never smoked. He has never used smokeless tobacco. He reports that he does not currently use alcohol. He reports that he does not use drugs.  Allergies:  Allergies  Allergen Reactions   Penicillins Other (See Comments)    Aggravated poison ivy reaction    Current meds: alfuzosin ER 10 mg tablet,extended release 24  hr amLODIPine 5 mg tablet atorvastatin 20 mg tablet HYDROcodone 5 mg-acetaminophen 325 mg tablet lisinopriL 40 mg tablet lysine 500 mg tablet meloxicam multivitamin omeprazole turmeric 450 mg-turmeric root extract 50 mg capsule zolpidem 5 mg tablet   Results for orders placed or performed during the hospital encounter of 10/04/22 (from the past 48 hour(s))  Surgical pcr screen     Status: None   Collection Time: 10/04/22  7:59 AM   Specimen: Nasal Mucosa; Nasal Swab  Result Value Ref Range   MRSA, PCR NEGATIVE NEGATIVE   Staphylococcus aureus NEGATIVE NEGATIVE    Comment: (NOTE) The Xpert SA Assay (FDA approved for NASAL specimens in patients 63 years of age and older), is one component of a comprehensive surveillance program. It is not intended to diagnose infection nor to guide or monitor treatment. Performed at [facility], [address] [place], [place] [address]   Basic metabolic panel per protocol     Status: Abnormal   Collection Time: 10/04/22  8:38 AM  Result Value Ref Range   Sodium 139 135 - 145 mmol/L   Potassium 4.0 3.5 - 5.1 mmol/L   Chloride 107 98 - 111 mmol/L   CO2 26 22 - 32 mmol/L   Glucose, Bld 112 (H) 70 - 99 mg/dL    Comment: Glucose reference range applies only to samples taken after fasting for at least 8 hours.   BUN 14 8 - 23 mg/dL   Creatinine, Ser 3.08 0.61 - 1.24 mg/dL   Calcium 9.1 8.9 - 65.7 mg/dL   GFR, Estimated >84 >69 mL/min  Comment: (NOTE) Calculated using the CKD-EPI Creatinine Equation (2021)    Anion gap 6 5 - 15    Comment: Performed at [facility], [address] [place], [place] [address]  CBC per protocol     Status: None   Collection Time: 10/04/22  8:38 AM  Result Value Ref Range   WBC 6.3 4.0 - 10.5 K/uL   RBC 4.64 4.22 - 5.81 MIL/uL   Hemoglobin 14.5 13.0 - 17.0 g/dL   HCT 09.3 23.5 - 57.3 %   MCV 94.8 80.0 - 100.0 fL   MCH 31.3 26.0 - 34.0 pg   MCHC 33.0 30.0 - 36.0  g/dL   RDW 22.0 25.4 - 27.0 %   Platelets 274 150 - 400 K/uL   nRBC 0.0 0.0 - 0.2 %    Comment: Performed at [facility], [address] [place], [place] [address]   No results found.  Review of Systems  Constitutional: Negative.   HENT: Negative.    Eyes: Negative.   Respiratory: Negative.    Cardiovascular: Negative.   Gastrointestinal: Negative.   Endocrine: Negative.   Genitourinary: Negative.   Musculoskeletal:  Positive for arthralgias, gait problem, joint swelling and myalgias.  Allergic/Immunologic: Negative.   Psychiatric/Behavioral: Negative.      There were no vitals taken for this visit. Physical Exam Constitutional:      Appearance: Normal appearance.  HENT:     Head: Normocephalic and atraumatic.     Right Ear: External ear normal.     Left Ear: External ear normal.     Nose: Nose normal.     Mouth/Throat:     Pharynx: Oropharynx is clear.  Eyes:     Conjunctiva/sclera: Conjunctivae normal.  Cardiovascular:     Rate and Rhythm: Normal rate and regular rhythm.     Pulses: Normal pulses.     Heart sounds: Normal heart sounds.  Pulmonary:     Effort: Pulmonary effort is normal.     Breath sounds: Normal breath sounds.  Abdominal:     General: Bowel sounds are normal.  Musculoskeletal:     Cervical back: Normal range of motion.     Comments: Patient is tender medial joint line bilaterally. Bilateral patellofemoral pain compression. Varus deformities are noted bilaterally. Walks with an antalgic gait. Mild effusions are noted bilaterally. He is ranges 0-1 40 left 0-1 30 right. No DVT ipsilateral hip and ankle exams unremarkable  Skin:    General: Skin is warm and dry.  Neurological:     Mental Status: He is alert.    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 Shelle Iron on 10/12/22. We again discussed the procedure itself as well as risks, complications and alternatives, including but not limited to DVT, PE, infx, bleeding, failure of procedure, need for secondary procedure including manipulation, nerve injury, ongoing pain/symptoms, anesthesia risk, even stroke or death. Also discussed typical post-op protocols, activity restrictions, need for PT, flexion/extension exercises, time out of work. Discussed need for DVT ppx post-op per protocol. Discussed dental ppx and infx prevention. Also discussed limitations post-operatively such as kneeling and squatting. All questions were answered. Patient desires to proceed with surgery as scheduled.  Will hold supplements, ASA and NSAIDs accordingly. Will remain NPO after midnight the night before  surgery. Will present to [facility] for pre-op testing. Anticipate hospital stay to include at least 2 midnights given medical history and to ensure proper pain control. Plan ASA for DVT ppx post-op. Plan Oxycodone, Colace, Miralax. Plan to d/c home post-op with family members at home for assistance and start outpt PT at our office immediately post-op. Will follow up 10-14 days post-op for suture removal and xrays.  Plan right total knee replacement  Dorothy Spark, PA-C for Dr Shelle Iron 10/05/2022, 2:10 PM

## 2022-10-06 DIAGNOSIS — H16103 Unspecified superficial keratitis, bilateral: Secondary | ICD-10-CM | POA: Diagnosis not present

## 2022-10-06 DIAGNOSIS — H35363 Drusen (degenerative) of macula, bilateral: Secondary | ICD-10-CM | POA: Diagnosis not present

## 2022-10-06 DIAGNOSIS — H2513 Age-related nuclear cataract, bilateral: Secondary | ICD-10-CM | POA: Diagnosis not present

## 2022-10-12 ENCOUNTER — Encounter (HOSPITAL_COMMUNITY): Payer: Self-pay | Admitting: Specialist

## 2022-10-12 ENCOUNTER — Ambulatory Visit (HOSPITAL_COMMUNITY): Payer: Medicare Other

## 2022-10-12 ENCOUNTER — Encounter (HOSPITAL_COMMUNITY)
Admission: RE | Disposition: A | Discharge status: Home / Self Care | Payer: Self-pay | Source: Ambulatory Visit | Attending: Specialist

## 2022-10-12 ENCOUNTER — Ambulatory Visit (HOSPITAL_COMMUNITY): Payer: Self-pay | Admitting: Anesthesiology

## 2022-10-12 ENCOUNTER — Ambulatory Visit (HOSPITAL_COMMUNITY)
Admission: RE | Admit: 2022-10-12 | Discharge: 2022-10-13 | Disposition: A | Discharge status: Home / Self Care | Payer: Medicare Other | Source: Ambulatory Visit | Attending: Specialist | Admitting: Specialist

## 2022-10-12 ENCOUNTER — Other Ambulatory Visit: Payer: Self-pay

## 2022-10-12 ENCOUNTER — Ambulatory Visit (HOSPITAL_BASED_OUTPATIENT_CLINIC_OR_DEPARTMENT_OTHER): Payer: Medicare Other | Admitting: Anesthesiology

## 2022-10-12 DIAGNOSIS — Z79899 Other long term (current) drug therapy: Secondary | ICD-10-CM | POA: Insufficient documentation

## 2022-10-12 DIAGNOSIS — Z791 Long term (current) use of non-steroidal anti-inflammatories (NSAID): Secondary | ICD-10-CM | POA: Diagnosis not present

## 2022-10-12 DIAGNOSIS — M1711 Unilateral primary osteoarthritis, right knee: Secondary | ICD-10-CM | POA: Diagnosis not present

## 2022-10-12 DIAGNOSIS — Z96651 Presence of right artificial knee joint: Secondary | ICD-10-CM | POA: Diagnosis not present

## 2022-10-12 DIAGNOSIS — M21161 Varus deformity, not elsewhere classified, right knee: Secondary | ICD-10-CM | POA: Diagnosis not present

## 2022-10-12 DIAGNOSIS — I10 Essential (primary) hypertension: Secondary | ICD-10-CM | POA: Diagnosis not present

## 2022-10-12 DIAGNOSIS — Z471 Aftercare following joint replacement surgery: Secondary | ICD-10-CM | POA: Diagnosis not present

## 2022-10-12 DIAGNOSIS — G8918 Other acute postprocedural pain: Secondary | ICD-10-CM | POA: Diagnosis not present

## 2022-10-12 HISTORY — PX: TOTAL KNEE ARTHROPLASTY: SHX125

## 2022-10-12 SURGERY — ARTHROPLASTY, KNEE, TOTAL
Anesthesia: Spinal | Site: Knee | Laterality: Right

## 2022-10-12 MED ORDER — METOCLOPRAMIDE HCL 5 MG/ML IJ SOLN
5.0000 mg | Freq: Three times a day (TID) | INTRAMUSCULAR | Status: DC | PRN
  Administered 2022-10-12: 10 mg via INTRAVENOUS
  Filled 2022-10-12: qty 2

## 2022-10-12 MED ORDER — ACETAMINOPHEN 500 MG PO TABS
1000.0000 mg | ORAL_TABLET | Freq: Four times a day (QID) | ORAL | Status: AC
  Administered 2022-10-12 – 2022-10-13 (×3): 1000 mg via ORAL
  Filled 2022-10-12 (×3): qty 2

## 2022-10-12 MED ORDER — AMLODIPINE BESYLATE 5 MG PO TABS
5.0000 mg | ORAL_TABLET | Freq: Every day | ORAL | Status: DC
  Administered 2022-10-12 – 2022-10-13 (×2): 5 mg via ORAL
  Filled 2022-10-12 (×2): qty 1

## 2022-10-12 MED ORDER — ORAL CARE MOUTH RINSE: 15.0000 mL | Freq: Once | OROMUCOSAL | Status: AC

## 2022-10-12 MED ORDER — FENTANYL CITRATE (PF) 100 MCG/2ML IJ SOLN
INTRAMUSCULAR | Status: AC
  Filled 2022-10-12: qty 2

## 2022-10-12 MED ORDER — DOCUSATE SODIUM 100 MG PO CAPS: 100.0000 mg | ORAL_CAPSULE | Freq: Two times a day (BID) | ORAL | 1 refills | Status: DC | PRN

## 2022-10-12 MED ORDER — METHOCARBAMOL 500 MG IVPB - SIMPLE MED
500.0000 mg | Freq: Four times a day (QID) | INTRAVENOUS | Status: DC | PRN
  Administered 2022-10-12: 500 mg via INTRAVENOUS

## 2022-10-12 MED ORDER — LISINOPRIL 20 MG PO TABS
40.0000 mg | ORAL_TABLET | Freq: Every day | ORAL | Status: DC
  Administered 2022-10-13: 40 mg via ORAL
  Filled 2022-10-12: qty 2

## 2022-10-12 MED ORDER — LORAZEPAM 1 MG PO TABS
1.0000 mg | ORAL_TABLET | Freq: Four times a day (QID) | ORAL | Status: DC | PRN
  Filled 2022-10-12: qty 1

## 2022-10-12 MED ORDER — SODIUM CHLORIDE (PF) 0.9 % IJ SOLN
INTRAMUSCULAR | Status: AC
  Filled 2022-10-12: qty 50

## 2022-10-12 MED ORDER — CHLORHEXIDINE GLUCONATE 0.12 % MT SOLN
15.0000 mL | Freq: Once | OROMUCOSAL | Status: AC
  Administered 2022-10-12: 15 mL via OROMUCOSAL

## 2022-10-12 MED ORDER — LIDOCAINE HCL (PF) 2 % IJ SOLN
INTRAMUSCULAR | Status: AC
  Filled 2022-10-12: qty 5

## 2022-10-12 MED ORDER — CEFAZOLIN SODIUM-DEXTROSE 2-4 GM/100ML-% IV SOLN
2.0000 g | Freq: Four times a day (QID) | INTRAVENOUS | Status: AC
  Administered 2022-10-12 (×2): 2 g via INTRAVENOUS
  Filled 2022-10-12: qty 100

## 2022-10-12 MED ORDER — RISAQUAD PO CAPS
1.0000 | ORAL_CAPSULE | Freq: Every day | ORAL | Status: DC
  Administered 2022-10-13: 1 via ORAL
  Filled 2022-10-12: qty 1

## 2022-10-12 MED ORDER — ASPIRIN 81 MG PO CHEW
81.0000 mg | CHEWABLE_TABLET | Freq: Two times a day (BID) | ORAL | Status: DC
  Administered 2022-10-13: 81 mg via ORAL
  Filled 2022-10-12: qty 1

## 2022-10-12 MED ORDER — METOCLOPRAMIDE HCL 5 MG PO TABS: 5.0000 mg | ORAL_TABLET | Freq: Three times a day (TID) | ORAL | Status: DC | PRN

## 2022-10-12 MED ORDER — BUPIVACAINE LIPOSOME 1.3 % IJ SUSP
INTRAMUSCULAR | Status: DC | PRN
  Administered 2022-10-12: 20 mL

## 2022-10-12 MED ORDER — HYDROMORPHONE HCL 1 MG/ML IJ SOLN
INTRAMUSCULAR | Status: AC
  Administered 2022-10-12: 2 mg via INTRAVENOUS
  Filled 2022-10-12: qty 1

## 2022-10-12 MED ORDER — FENTANYL CITRATE PF 50 MCG/ML IJ SOSY
PREFILLED_SYRINGE | INTRAMUSCULAR | Status: AC
  Administered 2022-10-12: 50 ug via INTRAVENOUS
  Filled 2022-10-12: qty 2

## 2022-10-12 MED ORDER — MIDAZOLAM HCL 5 MG/5ML IJ SOLN
INTRAMUSCULAR | Status: DC | PRN
  Administered 2022-10-12 (×2): 1 mg via INTRAVENOUS

## 2022-10-12 MED ORDER — OXYCODONE HCL 5 MG PO TABS
5.0000 mg | ORAL_TABLET | ORAL | Status: DC | PRN
  Administered 2022-10-12: 5 mg via ORAL
  Administered 2022-10-13 (×2): 10 mg via ORAL
  Filled 2022-10-12 (×3): qty 2

## 2022-10-12 MED ORDER — SODIUM CHLORIDE 0.9 % IR SOLN
Status: DC | PRN
  Administered 2022-10-12: 3000 mL

## 2022-10-12 MED ORDER — METHOCARBAMOL 500 MG PO TABS
500.0000 mg | ORAL_TABLET | Freq: Four times a day (QID) | ORAL | Status: DC | PRN
  Administered 2022-10-12 – 2022-10-13 (×2): 500 mg via ORAL
  Filled 2022-10-12 (×2): qty 1

## 2022-10-12 MED ORDER — EPHEDRINE 5 MG/ML INJ
INTRAVENOUS | Status: AC
  Filled 2022-10-12: qty 5

## 2022-10-12 MED ORDER — BISACODYL 5 MG PO TBEC: 5.0000 mg | DELAYED_RELEASE_TABLET | Freq: Every day | ORAL | Status: DC | PRN

## 2022-10-12 MED ORDER — 0.9 % SODIUM CHLORIDE (POUR BTL) OPTIME
TOPICAL | Status: DC | PRN
  Administered 2022-10-12: 1000 mL

## 2022-10-12 MED ORDER — LACTATED RINGERS IV SOLN: INTRAVENOUS | Status: DC

## 2022-10-12 MED ORDER — ALFUZOSIN HCL ER 10 MG PO TB24
10.0000 mg | ORAL_TABLET | Freq: Every day | ORAL | Status: DC
  Administered 2022-10-12 – 2022-10-13 (×2): 10 mg via ORAL
  Filled 2022-10-12 (×3): qty 1

## 2022-10-12 MED ORDER — ASPIRIN 81 MG PO TBEC: 81.0000 mg | DELAYED_RELEASE_TABLET | Freq: Two times a day (BID) | ORAL | 1 refills | Status: AC

## 2022-10-12 MED ORDER — BUPIVACAINE-EPINEPHRINE 0.25% -1:200000 IJ SOLN
INTRAMUSCULAR | Status: DC | PRN
  Administered 2022-10-12: 30 mL

## 2022-10-12 MED ORDER — FENTANYL CITRATE PF 50 MCG/ML IJ SOSY
PREFILLED_SYRINGE | INTRAMUSCULAR | Status: AC
  Administered 2022-10-12: 25 ug via INTRAVENOUS
  Filled 2022-10-12: qty 1

## 2022-10-12 MED ORDER — TRANEXAMIC ACID-NACL 1000-0.7 MG/100ML-% IV SOLN
1000.0000 mg | INTRAVENOUS | Status: AC
  Administered 2022-10-12: 1000 mg via INTRAVENOUS
  Filled 2022-10-12: qty 100

## 2022-10-12 MED ORDER — ONDANSETRON HCL 4 MG/2ML IJ SOLN
INTRAMUSCULAR | Status: AC
  Filled 2022-10-12: qty 2

## 2022-10-12 MED ORDER — ONDANSETRON HCL 4 MG PO TABS: 4.0000 mg | ORAL_TABLET | Freq: Four times a day (QID) | ORAL | Status: DC | PRN

## 2022-10-12 MED ORDER — DEXAMETHASONE SODIUM PHOSPHATE 10 MG/ML IJ SOLN
INTRAMUSCULAR | Status: AC
  Filled 2022-10-12: qty 1

## 2022-10-12 MED ORDER — MENTHOL 3 MG MT LOZG: 1.0000 | LOZENGE | OROMUCOSAL | Status: DC | PRN

## 2022-10-12 MED ORDER — POLYETHYLENE GLYCOL 3350 17 G PO PACK: 17.0000 g | PACK | Freq: Every day | ORAL | Status: DC | PRN

## 2022-10-12 MED ORDER — HYDROMORPHONE HCL 1 MG/ML IJ SOLN
0.5000 mg | INTRAMUSCULAR | Status: DC | PRN
  Administered 2022-10-12: 1 mg via INTRAVENOUS

## 2022-10-12 MED ORDER — EPHEDRINE SULFATE-NACL 50-0.9 MG/10ML-% IV SOSY
PREFILLED_SYRINGE | INTRAVENOUS | Status: DC | PRN
  Administered 2022-10-12: 10 mg via INTRAVENOUS
  Administered 2022-10-12 (×2): 5 mg via INTRAVENOUS

## 2022-10-12 MED ORDER — TRAMADOL HCL 50 MG PO TABS
50.0000 mg | ORAL_TABLET | Freq: Four times a day (QID) | ORAL | Status: DC | PRN
  Administered 2022-10-12 – 2022-10-13 (×2): 50 mg via ORAL
  Filled 2022-10-12: qty 1

## 2022-10-12 MED ORDER — DIPHENHYDRAMINE HCL 12.5 MG/5ML PO ELIX
12.5000 mg | ORAL_SOLUTION | ORAL | Status: DC | PRN
  Administered 2022-10-12: 25 mg via ORAL
  Filled 2022-10-12: qty 10

## 2022-10-12 MED ORDER — METHOCARBAMOL 500 MG IVPB - SIMPLE MED
INTRAVENOUS | Status: AC
  Filled 2022-10-12: qty 55

## 2022-10-12 MED ORDER — PROPOFOL 10 MG/ML IV BOLUS
INTRAVENOUS | Status: AC
  Filled 2022-10-12: qty 20

## 2022-10-12 MED ORDER — TRAMADOL HCL 50 MG PO TABS
ORAL_TABLET | ORAL | Status: AC
  Filled 2022-10-12: qty 1

## 2022-10-12 MED ORDER — DOCUSATE SODIUM 100 MG PO CAPS
100.0000 mg | ORAL_CAPSULE | Freq: Two times a day (BID) | ORAL | Status: DC
  Administered 2022-10-12 – 2022-10-13 (×2): 100 mg via ORAL
  Filled 2022-10-12 (×2): qty 1

## 2022-10-12 MED ORDER — PHENYLEPHRINE HCL (PRESSORS) 10 MG/ML IV SOLN
INTRAVENOUS | Status: AC
  Filled 2022-10-12: qty 1

## 2022-10-12 MED ORDER — BUPIVACAINE IN DEXTROSE 0.75-8.25 % IT SOLN
INTRATHECAL | Status: DC | PRN
  Administered 2022-10-12: 1.8 mL via INTRATHECAL

## 2022-10-12 MED ORDER — FENTANYL CITRATE PF 50 MCG/ML IJ SOSY
25.0000 ug | PREFILLED_SYRINGE | INTRAMUSCULAR | Status: DC | PRN
  Administered 2022-10-12: 50 ug via INTRAVENOUS
  Administered 2022-10-12: 25 ug via INTRAVENOUS

## 2022-10-12 MED ORDER — ONDANSETRON HCL 4 MG/2ML IJ SOLN
INTRAMUSCULAR | Status: DC | PRN
  Administered 2022-10-12: 4 mg via INTRAVENOUS

## 2022-10-12 MED ORDER — DEXAMETHASONE SODIUM PHOSPHATE 10 MG/ML IJ SOLN
INTRAMUSCULAR | Status: DC | PRN
  Administered 2022-10-12: 10 mg via INTRAVENOUS

## 2022-10-12 MED ORDER — KCL IN DEXTROSE-NACL 20-5-0.45 MEQ/L-%-% IV SOLN
INTRAVENOUS | Status: AC
  Filled 2022-10-12 (×2): qty 1000

## 2022-10-12 MED ORDER — OXYCODONE HCL 5 MG PO TABS
10.0000 mg | ORAL_TABLET | ORAL | Status: DC | PRN
  Administered 2022-10-12 – 2022-10-13 (×2): 10 mg via ORAL
  Filled 2022-10-12: qty 2

## 2022-10-12 MED ORDER — PROPOFOL 1000 MG/100ML IV EMUL
INTRAVENOUS | Status: AC
  Filled 2022-10-12: qty 100

## 2022-10-12 MED ORDER — OXYCODONE HCL 5 MG PO TABS: 5.0000 mg | ORAL_TABLET | ORAL | 0 refills | Status: DC | PRN

## 2022-10-12 MED ORDER — GABAPENTIN 300 MG PO CAPS
300.0000 mg | ORAL_CAPSULE | Freq: Three times a day (TID) | ORAL | Status: DC
  Administered 2022-10-12 – 2022-10-13 (×3): 300 mg via ORAL
  Filled 2022-10-12 (×3): qty 1

## 2022-10-12 MED ORDER — MIDAZOLAM HCL 2 MG/2ML IJ SOLN
INTRAMUSCULAR | Status: AC
  Filled 2022-10-12: qty 2

## 2022-10-12 MED ORDER — PHENOL 1.4 % MT LIQD: 1.0000 | OROMUCOSAL | Status: DC | PRN

## 2022-10-12 MED ORDER — CEFAZOLIN SODIUM-DEXTROSE 2-4 GM/100ML-% IV SOLN
INTRAVENOUS | Status: AC
  Filled 2022-10-12: qty 100

## 2022-10-12 MED ORDER — AMISULPRIDE (ANTIEMETIC) 5 MG/2ML IV SOLN: 10.0000 mg | Freq: Once | INTRAVENOUS | Status: DC | PRN

## 2022-10-12 MED ORDER — CEFAZOLIN SODIUM-DEXTROSE 2-4 GM/100ML-% IV SOLN
2.0000 g | INTRAVENOUS | Status: AC
  Administered 2022-10-12: 2 g via INTRAVENOUS
  Filled 2022-10-12: qty 100

## 2022-10-12 MED ORDER — ONDANSETRON HCL 4 MG/2ML IJ SOLN
4.0000 mg | Freq: Four times a day (QID) | INTRAMUSCULAR | Status: DC | PRN
  Administered 2022-10-12: 4 mg via INTRAVENOUS
  Filled 2022-10-12: qty 2

## 2022-10-12 MED ORDER — BUPIVACAINE-EPINEPHRINE 0.25% -1:200000 IJ SOLN
INTRAMUSCULAR | Status: AC
  Filled 2022-10-12: qty 1

## 2022-10-12 MED ORDER — HYDROMORPHONE HCL 1 MG/ML IJ SOLN
INTRAMUSCULAR | Status: AC
  Administered 2022-10-12: 1 mg via INTRAVENOUS
  Filled 2022-10-12: qty 2

## 2022-10-12 MED ORDER — BUPIVACAINE LIPOSOME 1.3 % IJ SUSP
INTRAMUSCULAR | Status: AC
  Filled 2022-10-12: qty 20

## 2022-10-12 MED ORDER — CLONIDINE HCL (ANALGESIA) 100 MCG/ML EP SOLN
EPIDURAL | Status: DC | PRN
  Administered 2022-10-12: 50 ug

## 2022-10-12 MED ORDER — ACETAMINOPHEN 325 MG PO TABS
325.0000 mg | ORAL_TABLET | Freq: Four times a day (QID) | ORAL | Status: DC | PRN
  Administered 2022-10-13: 650 mg via ORAL
  Filled 2022-10-12: qty 2

## 2022-10-12 MED ORDER — OXYCODONE HCL 5 MG PO TABS
ORAL_TABLET | ORAL | Status: AC
  Filled 2022-10-12: qty 1

## 2022-10-12 MED ORDER — MAGNESIUM CITRATE PO SOLN: 1.0000 | Freq: Once | ORAL | Status: DC | PRN

## 2022-10-12 MED ORDER — ALUM & MAG HYDROXIDE-SIMETH 200-200-20 MG/5ML PO SUSP: 30.0000 mL | ORAL | Status: DC | PRN

## 2022-10-12 MED ORDER — ROPIVACAINE HCL 5 MG/ML IJ SOLN
INTRAMUSCULAR | Status: DC | PRN
  Administered 2022-10-12: 20 mL via PERINEURAL

## 2022-10-12 MED ORDER — ZOLPIDEM TARTRATE 5 MG PO TABS
5.0000 mg | ORAL_TABLET | Freq: Every evening | ORAL | Status: DC | PRN
  Administered 2022-10-12: 5 mg via ORAL
  Filled 2022-10-12: qty 1

## 2022-10-12 MED ORDER — HYDROMORPHONE HCL 1 MG/ML IJ SOLN
1.0000 mg | INTRAMUSCULAR | Status: DC | PRN
  Administered 2022-10-12: 1 mg via INTRAVENOUS
  Filled 2022-10-12: qty 1
  Filled 2022-10-12: qty 2

## 2022-10-12 MED ORDER — PROPOFOL 10 MG/ML IV BOLUS
INTRAVENOUS | Status: DC | PRN
Start: 2022-10-12 — End: 2022-10-12
  Administered 2022-10-12: 20 mg via INTRAVENOUS

## 2022-10-12 MED ORDER — FENTANYL CITRATE (PF) 100 MCG/2ML IJ SOLN
INTRAMUSCULAR | Status: DC | PRN
  Administered 2022-10-12 (×2): 50 ug via INTRAVENOUS

## 2022-10-12 MED ORDER — METHOCARBAMOL 500 MG PO TABS: 500.0000 mg | ORAL_TABLET | Freq: Three times a day (TID) | ORAL | 1 refills | Status: DC | PRN

## 2022-10-12 MED ORDER — STERILE WATER FOR IRRIGATION IR SOLN
Status: DC | PRN
  Administered 2022-10-12: 2000 mL

## 2022-10-12 MED ORDER — PROPOFOL 500 MG/50ML IV EMUL
INTRAVENOUS | Status: DC | PRN
  Administered 2022-10-12: 50 ug/kg/min via INTRAVENOUS

## 2022-10-12 MED ORDER — ADULT MULTIVITAMIN W/MINERALS CH
1.0000 | ORAL_TABLET | Freq: Every day | ORAL | Status: DC
  Administered 2022-10-13: 1 via ORAL
  Filled 2022-10-12: qty 1

## 2022-10-12 MED ORDER — OMEPRAZOLE 20 MG PO CPDR
20.0000 mg | DELAYED_RELEASE_CAPSULE | Freq: Every day | ORAL | Status: DC
  Administered 2022-10-12 – 2022-10-13 (×2): 20 mg via ORAL
  Filled 2022-10-12 (×2): qty 1

## 2022-10-12 MED ORDER — ACETAMINOPHEN 500 MG PO TABS: 1000.0000 mg | ORAL_TABLET | Freq: Once | ORAL | Status: AC

## 2022-10-12 MED ORDER — POLYETHYLENE GLYCOL 3350 17 G PO PACK: 17.0000 g | PACK | Freq: Every day | ORAL | 0 refills | Status: DC

## 2022-10-12 MED ORDER — ACETAMINOPHEN 10 MG/ML IV SOLN
1000.0000 mg | INTRAVENOUS | Status: AC
  Administered 2022-10-12: 1000 mg via INTRAVENOUS
  Filled 2022-10-12: qty 100

## 2022-10-12 MED ORDER — SODIUM CHLORIDE 0.9% FLUSH
INTRAVENOUS | Status: DC | PRN
  Administered 2022-10-12: 40 mL

## 2022-10-12 SURGICAL SUPPLY — 76 items
AGENT HMST SPONGE THK3/8 (HEMOSTASIS)
ATTUNE PS FEM RT SZ 6 CEM KNEE (Femur) IMPLANT
ATTUNE PSRP INSR SZ6 7 KNEE (Insert) IMPLANT
BAG COUNTER SPONGE SURGICOUNT (BAG) IMPLANT
BAG DECANTER FOR FLEXI CONT (MISCELLANEOUS) ×1 IMPLANT
BAG SPEC THK2 15X12 ZIP CLS (MISCELLANEOUS)
BAG SPNG CNTER NS LX DISP (BAG)
BAG ZIPLOCK 12X15 (MISCELLANEOUS) IMPLANT
BASE TIBIA ATTUNE KNEE SYS SZ6 (Knees) IMPLANT
BLADE SAW SGTL 11.0X1.19X90.0M (BLADE) ×1 IMPLANT
BLADE SAW SGTL 13.0X1.19X90.0M (BLADE) ×1 IMPLANT
BLADE SURG SZ10 CARB STEEL (BLADE) ×2 IMPLANT
BNDG CMPR 5X4 KNIT ELC UNQ LF (GAUZE/BANDAGES/DRESSINGS) ×1
BNDG CMPR 6 X 5 YARDS HK CLSR (GAUZE/BANDAGES/DRESSINGS) ×1
BNDG ELASTIC 4INX 5YD STR LF (GAUZE/BANDAGES/DRESSINGS) ×1 IMPLANT
BNDG ELASTIC 6INX 5YD STR LF (GAUZE/BANDAGES/DRESSINGS) ×1 IMPLANT
BOWL SMART MIX CTS (DISPOSABLE) ×1 IMPLANT
BSPLAT TIB 6 CMNT ROT PLAT STR (Knees) ×1 IMPLANT
CEMENT HV SMART SET (Cement) ×2 IMPLANT
COVER SURGICAL LIGHT HANDLE (MISCELLANEOUS) ×1 IMPLANT
CUFF TOURN SGL QUICK 34 (TOURNIQUET CUFF) ×1
CUFF TRNQT CYL 34X4.125X (TOURNIQUET CUFF) ×1 IMPLANT
DRAPE INCISE IOBAN 66X45 STRL (DRAPES) IMPLANT
DRAPE ORTHO SPLIT 77X108 STRL (DRAPES) ×2
DRAPE SHEET LG 3/4 BI-LAMINATE (DRAPES) ×1 IMPLANT
DRAPE SURG ORHT 6 SPLT 77X108 (DRAPES) ×2 IMPLANT
DRAPE TOP 10253 STERILE (DRAPES) ×1 IMPLANT
DRAPE U-SHAPE 47X51 STRL (DRAPES) ×1 IMPLANT
DRSG AQUACEL AG ADV 3.5X10 (GAUZE/BANDAGES/DRESSINGS) ×1 IMPLANT
DRSG TEGADERM 4X4.75 (GAUZE/BANDAGES/DRESSINGS) IMPLANT
DURAPREP 26ML APPLICATOR (WOUND CARE) ×1 IMPLANT
ELECT BLADE TIP CTD 4 INCH (ELECTRODE) IMPLANT
ELECT REM PT RETURN 15FT ADLT (MISCELLANEOUS) ×1 IMPLANT
EVACUATOR 1/8 PVC DRAIN (DRAIN) IMPLANT
GAUZE SPONGE 2X2 8PLY STRL LF (GAUZE/BANDAGES/DRESSINGS) IMPLANT
GLOVE BIO SURGEON STRL SZ7 (GLOVE) ×1 IMPLANT
GLOVE BIOGEL PI IND STRL 7.0 (GLOVE) ×1 IMPLANT
GLOVE BIOGEL PI IND STRL 8 (GLOVE) ×1 IMPLANT
GLOVE SURG SS PI 8.0 STRL IVOR (GLOVE) ×1 IMPLANT
GOWN STRL REUS W/ TWL XL LVL3 (GOWN DISPOSABLE) ×2 IMPLANT
GOWN STRL REUS W/TWL XL LVL3 (GOWN DISPOSABLE) ×2
HANDPIECE INTERPULSE COAX TIP (DISPOSABLE) ×1
HEMOSTAT SPONGE AVITENE ULTRA (HEMOSTASIS) IMPLANT
HOLDER FOLEY CATH W/STRAP (MISCELLANEOUS) IMPLANT
IMMOBILIZER KNEE 20 (SOFTGOODS) ×1
IMMOBILIZER KNEE 20 THIGH 36 (SOFTGOODS) ×1 IMPLANT
IMMOBILIZER KNEE 22 UNIV (SOFTGOODS) IMPLANT
KIT TURNOVER KIT A (KITS) IMPLANT
MANIFOLD NEPTUNE II (INSTRUMENTS) ×1 IMPLANT
NS IRRIG 1000ML POUR BTL (IV SOLUTION) IMPLANT
PACK TOTAL KNEE CUSTOM (KITS) ×1 IMPLANT
PATELLA MEDIAL ATTUN 35MM KNEE (Knees) IMPLANT
PIN STEINMAN FIXATION KNEE (PIN) IMPLANT
PROTECTOR NERVE ULNAR (MISCELLANEOUS) ×1 IMPLANT
SAW OSC TIP CART 19.5X105X1.3 (SAW) IMPLANT
SEALER BIPOLAR AQUA 6.0 (INSTRUMENTS) IMPLANT
SET HNDPC FAN SPRY TIP SCT (DISPOSABLE) ×1 IMPLANT
SOLUTION PRONTOSAN WOUND 350ML (IRRIGATION / IRRIGATOR) ×1 IMPLANT
SPIKE FLUID TRANSFER (MISCELLANEOUS) ×1 IMPLANT
STAPLER VISISTAT (STAPLE) IMPLANT
STRIP CLOSURE SKIN 1/2X4 (GAUZE/BANDAGES/DRESSINGS) IMPLANT
SUT BONE WAX W31G (SUTURE) ×1 IMPLANT
SUT MNCRL AB 4-0 PS2 18 (SUTURE) IMPLANT
SUT STRATAFIX 0 PDS 27 VIOLET (SUTURE) ×1
SUT VIC AB 1 CT1 27 (SUTURE) ×3
SUT VIC AB 1 CT1 27XBRD ANTBC (SUTURE) ×3 IMPLANT
SUT VIC AB 2-0 CT1 27 (SUTURE) ×3
SUT VIC AB 2-0 CT1 TAPERPNT 27 (SUTURE) ×3 IMPLANT
SUTURE STRATFX 0 PDS 27 VIOLET (SUTURE) ×1 IMPLANT
SYR 3ML LL SCALE MARK (SYRINGE) IMPLANT
TIBIA ATTUNE KNEE SYS BASE SZ6 (Knees) ×1 IMPLANT
TRAY FOLEY MTR SLVR 16FR STAT (SET/KITS/TRAYS/PACK) ×1 IMPLANT
TUBE SUCTION HIGH CAP CLEAR NV (SUCTIONS) ×1 IMPLANT
WATER STERILE IRR 1000ML POUR (IV SOLUTION) ×1 IMPLANT
WIPE CHG 2% PREP (PERSONAL CARE ITEMS) ×1 IMPLANT
WRAP KNEE MAXI GEL POST OP (GAUZE/BANDAGES/DRESSINGS) ×1 IMPLANT

## 2022-10-12 NOTE — Transfer of Care (Signed)
Immediate Anesthesia Transfer of Care Note  Patient: Curtis Davis  Procedure(s) Performed: TOTAL KNEE ARTHROPLASTY (Right: Knee)  Patient Location: PACU  Anesthesia Type:Spinal  Level of Consciousness: awake  Airway & Oxygen Therapy: Patient Spontanous Breathing and Patient connected to face mask oxygen  Post-op Assessment: Report given to RN and Post -op Vital signs reviewed and stable  Post vital signs: Reviewed and stable  Last Vitals:  Vitals Value Taken Time  BP 113/65 10/12/22 0954  Temp    Pulse 66 10/12/22 0955  Resp 12 10/12/22 0955  SpO2 98 % 10/12/22 0955  Vitals shown include unfiled device data.  Last Pain:  Vitals:   10/12/22 0618  TempSrc: Oral  PainSc:          Complications: No notable events documented.

## 2022-10-12 NOTE — Anesthesia Procedure Notes (Addendum)
Anesthesia Regional Block: Adductor canal block   Pre-Anesthetic Checklist: , timeout performed,  Correct Patient, Correct Site, Correct Laterality,  Correct Procedure, Correct Position, site marked,  Risks and benefits discussed,  Surgical consent,  Pre-op evaluation,  At surgeon's request and post-op pain management  Laterality: Right  Prep: chloraprep       Needles:  Injection technique: Single-shot  Needle Type: Echogenic Needle     Needle Length: 9cm  Needle Gauge: 21     Additional Needles:   Procedures:,,,, ultrasound used (permanent image in chart),,    Narrative:  Start time: 10/12/2022 6:55 AM End time: 10/12/2022 7:01 AM Injection made incrementally with aspirations every 5 mL.  Performed by: Personally  Anesthesiologist: Marcene Duos, MD

## 2022-10-12 NOTE — Interval H&P Note (Signed)
History and Physical Interval Note:  10/12/2022 7:16 AM  Curtis Davis  has presented today for surgery, with the diagnosis of Right knee degenerative joint disease.  The various methods of treatment have been discussed with the patient and family. After consideration of risks, benefits and other options for treatment, the patient has consented to  Procedure(s): TOTAL KNEE ARTHROPLASTY (Right) as a surgical intervention.  The patient's history has been reviewed, patient examined, no change in status, stable for surgery.  I have reviewed the patient's chart and labs.  Questions were answered to the patient's satisfaction.     Javier Docker

## 2022-10-12 NOTE — Anesthesia Preprocedure Evaluation (Signed)
Anesthesia Evaluation  Patient identified by MRN, date of birth, ID band Patient awake    Reviewed: Allergy & Precautions, NPO status , Patient's Chart, lab work & pertinent test results  Airway Mallampati: II  TM Distance: >3 FB Neck ROM: Full    Dental   Pulmonary neg pulmonary ROS   breath sounds clear to auscultation       Cardiovascular hypertension, Pt. on medications  Rhythm:Regular Rate:Normal     Neuro/Psych negative neurological ROS     GI/Hepatic negative GI ROS, Neg liver ROS,,,  Endo/Other  negative endocrine ROS    Renal/GU negative Renal ROS     Musculoskeletal  (+) Arthritis ,    Abdominal   Peds  Hematology negative hematology ROS (+)   Anesthesia Other Findings   Reproductive/Obstetrics                             Anesthesia Physical Anesthesia Plan  ASA: 2  Anesthesia Plan: Spinal   Post-op Pain Management: Regional block* and Tylenol PO (pre-op)*   Induction:   PONV Risk Score and Plan: 1 and Propofol infusion, Dexamethasone, Ondansetron and Treatment may vary due to age or medical condition  Airway Management Planned: Natural Airway and Simple Face Mask  Additional Equipment:   Intra-op Plan:   Post-operative Plan:   Informed Consent: I have reviewed the patients History and Physical, chart, labs and discussed the procedure including the risks, benefits and alternatives for the proposed anesthesia with the patient or authorized representative who has indicated his/her understanding and acceptance.       Plan Discussed with: CRNA  Anesthesia Plan Comments:        Anesthesia Quick Evaluation

## 2022-10-12 NOTE — Care Plan (Signed)
Ortho Bundle Case Management Note  Patient Details  Name: Curtis Davis MRN: [id number] Date of Birth: [birth year]                  R TKA on 10/12/22.  DCP: Home with wife Harriett Sine.  DME: No needs. Has RW and cane.  PT: EO 7/29 at 2:30pm   DME Arranged:  N/A DME Agency:       Additional Comments: Please contact me with any questions of if this plan should need to change.    Despina Pole, Case Manager  [facility]  [phone] 10/12/2022, 8:20 AM

## 2022-10-12 NOTE — Discharge Instructions (Signed)
Elevate leg above heart 6x a day for 20minutes each °Use knee immobilizer while walking until can SLR x 10 °Use knee immobilizer in bed to keep knee in extension °Aquacel dressing may remain in place until follow up. May shower with aquacel dressing in place. If the dressing becomes saturated or peels off, you may remove aquacel dressing. Do not remove steri-strips if they are present. Place new dressing with gauze and tape or ACE bandage which should be kept clean and dry and changed daily. °

## 2022-10-12 NOTE — Op Note (Signed)
NAME: Curtis Davis, Curtis Davis. MEDICAL RECORD NO: [id number] ACCOUNT NO: [id number] DATE OF BIRTH: [birth year] FACILITY: [facility] LOCATION: [facility] PHYSICIAN: Javier Docker, MD  Operative Report   DATE OF PROCEDURE: 10/12/2022  PREOPERATIVE DIAGNOSES:  End-stage osteoarthrosis of the right knee and varus deformity.  POSTOPERATIVE DIAGNOSES:  End-stage osteoarthrosis of the right knee and varus deformity.  PROCEDURE PERFORMED:  Right total knee arthroplasty utilizing Attune rotating platform 6 femur, 6 tibia, 7 mm insert, 35 patella.  ANESTHESIA:  Spinal.  ASSISTANT:  Andrez Grime, PA.  HISTORY:  A 77 year old with end-stage osteoarthrosis of medial compartment refractory to conservative treatment, bone-on-bone.  He was indicated for replacement of degenerated joint.  Risks and benefits discussed including bleeding, infection, damage to  neurovascular structures, no change in symptoms, worsening symptoms, DVT, PE, anesthetic complications, etc.  TECHNIQUE:  The patient in supine position, after induction of adequate spinal anesthesia and LMA, the right lower extremity was prepped and draped in the usual sterile fashion after time-out identifying the extremity.  Thigh tourniquet inflated to 225  mmHg.  Midline incision was then made over the knee.  Full thickness flaps developed.  We exsanguinated the extremity first.  Median parapatellar arthrotomy performed. Patella everted, knee flexed.  Tricompartmental osteoarthrosis was noted bone-on-bone  medial compartment and patellofemoral joint.  Leksell rongeur utilized to remove the remnants of medial and lateral menisci and the ACL and to perform a notch just above the femoral notch as a starting hole for the femoral drill, which we drilled in line  with the femur to enter the femoral canal, it was irrigated, T-handled, probed and then intramedullary guide 5-degree right with 9 off the distal femur was selected performed that cut.  Then, I subluxed  the tibia, removed remnants of medial and lateral  menisci.  Cauterized the geniculates.  Bone-on-bone side was medially.  That was the low side.  We took 3 off the low side with the external alignment guide parallel to the shaft, 3 degrees slope, bisecting the tibiotalar joint, it was pinned.  I  performed the cut, protecting soft tissues posteriorly at all times.  Then, in extension, we tried a 6 mm insert, slight hyperextension.  I then flexed the knee, sized the femur off the anterior cortex.  3-degree right.  This was then pinned at a 6.  I  then put a distal femoral block. Performed anterior, posterior and chamfer cuts, protecting soft tissues at all times.  Subluxed the tibia once again, sized it to a 6 maximizing coverage just the medial aspect of the tibial tubercle.  This was pinned,  harvested bone centrally and impacted into the distal femur. Drilled centrally, used our punch guide.  We then turned attention back to the femur.  We performed a box cut after jig was applied bisecting the canal.  We impacted the 6 femoral trial, it fit  flush.  I placed a 7 mm insert, reduced the knee and had full extension, full flexion, good stability with varus and valgus stressing at 0-30 degrees, negative anterior drawer.  Everted the patella, measured it to a 15.  Using the patellar jig external,  9-1/2 off of that.  Planed the patella, the remaining residual was 15 mm.  We sized it to a 35 medializing the peg holes with the trial paddle parallel to the joint.  A trial patella was then placed after the peg holes were drilled and I had excellent  patellofemoral tracking.  Then, all instrumentation was removed. We checked posteriorly,  popliteus and capsule were intact.  We cauterized the geniculates.  We injected Exparel through the posterior medial capsule, penetrating it and aspirating without  break in the vacuum and injecting 10 mL posteriorly.  We anesthetized the menisci rim medially and laterally,  periosteum of the femur, quadriceps tendon, proximal tibia, etc.  I then flexed the knee after pulsatile lavage, thoroughly cleaning all  surfaces and dried then. Subluxed the tibia.  Mixed cement on back table in appropriate fashion under vacuum.  I then placed cement in the proximal tibia, digitally pressurizing it after I had placed drill holes in sclerotic bone in the proximal tibia.   I then placed cement on the 6 insert tibia and impacted into place,  redundant cement removed. I cemented the femur and cemented the femoral component, impacted into place.  It fit flush. 7 mm trial insert was then placed.  I reduced the knee, held in  axial load in extension throughout the curing of the cement.  Redundant cement removed. I cemented and clamped the patella.  Marcaine with epinephrine was placed in the wound during curing the cement as was Prontosan. The wound was then covered.   Tourniquet was deflated after curing of the cement at 56 minutes.  Any minor bleeding was cauterized.  I had excellent patellofemoral tracking, full extension, flexion, and good stability.  The insert was removed.  I meticulously removed all redundant  cement. Copiously irrigated with pulsatile lavage followed by Prontosan and chose a 7 mm insert permanent and impacted it into place without difficulty and inserted it with towel clips.  The patient had good stability.  Negative anterior drawer.   Stability at 0 and 30 degrees.  Full extension, full flexion, excellent patellofemoral tracking.  After copious irrigation, I reapproximated the patellar arthrotomy with the knee in partial flexion with #1 Vicryl in interrupted figure-of-eight sutures,  oversewn with a running Stratafix.  After this, I had excellent patellofemoral tracking.  Copiously irrigated layer once again, subcutaneous with 2-0 and skin with subcuticular Prolene.  Sterile dressing applied, placed in an immobilizer, extubated, and  transported to the recovery room in  satisfactory condition.  The patient tolerated the procedure well.  No complications.  Assistant Andrez Grime, [place] was used throughout the case for patient positioning, gentle intermittent retraction, closure.  BLOOD LOSS:  25 mL.   VAI D: 10/12/2022 9:36:50 am T: 10/12/2022 9:50:00 am  JOB: [id number]/ [id number]

## 2022-10-12 NOTE — Brief Op Note (Signed)
10/12/2022  7:16 AM  PATIENT:  Curtis Davis  77 y.o. male  PRE-OPERATIVE DIAGNOSIS:  Right knee degenerative joint disease  POST-OPERATIVE DIAGNOSIS:  * No post-op diagnosis entered *  PROCEDURE:  Procedure(s): TOTAL KNEE ARTHROPLASTY (Right)  SURGEON:  Surgeons and Role:    Jene Every, MD - Primary  PHYSICIAN ASSISTANT:   ASSISTANTS: Bissell   ANESTHESIA:   spinal  EBL:  25   BLOOD ADMINISTERED:none  DRAINS: none   LOCAL MEDICATIONS USED:  MARCAINE     SPECIMEN:  No Specimen  DISPOSITION OF SPECIMEN:  N/A  COUNTS:  YES  TOURNIQUET: DICTATION: 56 min  .Other Dictation: Dictation Number 2  [id number]  PLAN OF CARE: Admit for overnight observation  PATIENT DISPOSITION:  PACU - hemodynamically stable.   Delay start of Pharmacological VTE agent (>24hrs) due to surgical blood loss or risk of bleeding: yes

## 2022-10-12 NOTE — Anesthesia Postprocedure Evaluation (Signed)
Anesthesia Post Note  Patient: Curtis Davis  Procedure(s) Performed: TOTAL KNEE ARTHROPLASTY (Right: Knee)     Patient location during evaluation: PACU Anesthesia Type: Spinal Level of consciousness: awake and alert Pain management: pain level controlled Vital Signs Assessment: post-procedure vital signs reviewed and stable Respiratory status: spontaneous breathing and respiratory function stable Cardiovascular status: blood pressure returned to baseline and stable Postop Assessment: spinal receding Anesthetic complications: no  No notable events documented.  Last Vitals:  Vitals:   10/12/22 1230 10/12/22 1300  BP: 139/79 (!) 150/69  Pulse: 63 72  Resp: 14 13  Temp:    SpO2: 99% 100%    Last Pain:  Vitals:   10/12/22 1230  TempSrc:   PainSc: 8                  Kennieth Rad

## 2022-10-12 NOTE — Anesthesia Procedure Notes (Signed)
Spinal  Patient location during procedure: OR Start time: 10/12/2022 7:32 AM End time: 10/12/2022 7:39 AM Reason for block: surgical anesthesia Staffing Performed: anesthesiologist  Anesthesiologist: Marcene Duos, MD Performed by: Marcene Duos, MD Authorized by: Marcene Duos, MD   Preanesthetic Checklist Completed: patient identified, IV checked, site marked, risks and benefits discussed, surgical consent, monitors and equipment checked, pre-op evaluation and timeout performed Spinal Block Patient position: sitting Prep: DuraPrep Patient monitoring: heart rate, cardiac monitor, continuous pulse ox and blood pressure Approach: midline Location: L5-S1 Injection technique: single-shot Needle Needle type: Pencan  Needle gauge: 24 G Needle length: 9 cm Assessment Sensory level: T4 Events: CSF return and second provider

## 2022-10-12 NOTE — Evaluation (Signed)
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 Pt PMH includes but is not limited to: arthritis, skin Ca and HTN.  Clinical Impression      Curtis Davis is a 77 y.o. male POD 0 s/p R TKA. Patient reports mod I with mobility at baseline. Patient is now limited by functional impairments (see PT problem list below) and requires min A for bed mobility and mod A for sit to stand transfers. Patient was unable to safely ambulate anteriorly due to pain, N and V. Pt able to side step to R 4  feet with RW and min A level of assist to improve positioning to return to bed. Patient will benefit from continued skilled PT interventions to address impairments and progress towards PLOF. Acute PT will follow to progress mobility and stair training in preparation for safe discharge home with family support and OPPT services scheduled for 7/29.     Assistance Recommended at Discharge Intermittent Supervision/Assistance  If plan is discharge home, recommend the following:  Can travel by private vehicle  A lot of help with walking and/or transfers;A lot of help with bathing/dressing/bathroom;Assistance with cooking/housework;Assist for transportation        Equipment Recommendations None recommended by PT  Recommendations for Other Services       Functional Status Assessment Patient has had a recent decline in their functional status and demonstrates the ability to make significant improvements in function in a reasonable and predictable amount of time.     Precautions / Restrictions Precautions Precautions: Knee;Fall Restrictions Weight Bearing Restrictions: No      Mobility  Bed Mobility Overal bed mobility: Needs Assistance Bed Mobility: Supine to Sit, Sit to Supine     Supine to sit: Min  assist, HOB elevated Sit to supine: Min assist   General bed mobility comments: cues, increased time, reports of dizziness once seated EOB, min A for LLE for sit to supine    Transfers Overall transfer level: Needs assistance Equipment used: Rolling walker (2 wheels) Transfers: Sit to/from Stand Sit to Stand: Mod assist, From elevated surface           General transfer comment: cues for proper UE placement, assist for power up and cues for extension posture, reports of dizziness in standing, PT did not assess for orthostatic hypotension at eval due to active N and V.    Ambulation/Gait               General Gait Details: gait assessment limited to side step to R ~ 4 feet with RW and min A to reposition prior to returning to bed  Stairs            Wheelchair Mobility     Tilt Bed    Modified Rankin (Stroke Patients Only)       Balance Overall balance assessment: Needs assistance Sitting-balance support: Feet supported Sitting balance-Leahy Scale: Good     Standing balance support: Bilateral upper extremity supported, Reliant on assistive device for balance, During functional activity Standing balance-Leahy Scale: Poor                               Pertinent Vitals/Pain Pain Assessment Pain Assessment: 0-10 Pain Score: 7  Pain Location: R leg/knee Pain Descriptors /  Indicators: Aching, Constant, Discomfort, Operative site guarding Pain Intervention(s): Limited activity within patient's tolerance, Monitored during session, Premedicated before session, Patient requesting pain meds-RN notified, Repositioned, Ice applied    Home Living Family/patient expects to be discharged to:: Private residence Living Arrangements: Spouse/significant other Available Help at Discharge: Family Type of Home: House Home Access: Ramped entrance       Home Layout: One level Home Equipment: Teacher, English as a foreign language (2 wheels);Cane - single point       Prior Function Prior Level of Function : Independent/Modified Independent;Driving;Working/employed             Mobility Comments: intermittent use of SPC for prolonged distances, IND with houshold mobility, ADLs, self care tasks       Hand Dominance        Extremity/Trunk Assessment        Lower Extremity Assessment Lower Extremity Assessment: RLE deficits/detail RLE Deficits / Details: ankle DF/PF 5/5; SLR < 10 degrees    Cervical / Trunk Assessment Cervical / Trunk Assessment:  (wfl)  Communication   Communication: No difficulties  Cognition Arousal/Alertness: Awake/alert Behavior During Therapy: WFL for tasks assessed/performed Overall Cognitive Status: Within Functional Limits for tasks assessed                                 General Comments: reports of nausea and vomiting prior to PT arrival and pt provided with medication to address nausea        General Comments General comments (skin integrity, edema, etc.): foley catheter removed in PACU    Exercises     Assessment/Plan    PT Assessment Patient needs continued PT services  PT Problem List Decreased strength;Decreased range of motion;Decreased activity tolerance;Decreased balance;Decreased mobility;Decreased coordination;Pain       PT Treatment Interventions DME instruction;Gait training;Functional mobility training;Therapeutic activities;Therapeutic exercise;Balance training;Neuromuscular re-education;Patient/family education;Modalities    PT Goals (Current goals can be found in the Care Plan section)  Acute Rehab PT Goals Patient Stated Goal: return to work, get strong enough to have the L knee done PT Goal Formulation: With patient Time For Goal Achievement: 11/02/22 Potential to Achieve Goals: Good    Frequency 7X/week     Co-evaluation               AM-PAC PT "6 Clicks" Mobility  Outcome Measure Help needed turning from your back to your side while in a flat bed  without using bedrails?: A Little Help needed moving from lying on your back to sitting on the side of a flat bed without using bedrails?: A Little Help needed moving to and from a bed to a chair (including a wheelchair)?: A Little Help needed standing up from a chair using your arms (e.g., wheelchair or bedside chair)?: A Lot Help needed to walk in hospital room?: Total Help needed climbing 3-5 steps with a railing? : Total 6 Click Score: 13    End of Session Equipment Utilized During Treatment: Gait belt Activity Tolerance: Patient limited by pain;Treatment limited secondary to medical complications (Comment) (N and V) Patient left: in bed;with call bell/phone within reach;with family/visitor present Nurse Communication: Mobility status;Other (comment);Patient requests pain meds (N and V) PT Visit Diagnosis: Unsteadiness on feet (R26.81);Other abnormalities of gait and mobility (R26.89);Muscle weakness (generalized) (M62.81);Pain Pain - Right/Left: Right Pain - part of body: Knee;Leg    Time: 6295-2841 PT Time Calculation (min) (ACUTE ONLY): 49 min   Charges:   PT Evaluation $  PT Eval Low Complexity: 1 Low PT Treatments $Therapeutic Activity: 23-37 mins PT General Charges $$ ACUTE PT VISIT: 1 Visit         Curtis Davis, PT Acute Rehab   Curtis Davis 10/12/2022, 6:12 PM

## 2022-10-12 NOTE — Plan of Care (Signed)
  Problem: Activity: Goal: Ability to avoid complications of mobility impairment will improve Outcome: Progressing Goal: Range of joint motion will improve Outcome: Progressing   Problem: Pain Management: Goal: Pain level will decrease with appropriate interventions Outcome: Progressing   Problem: Safety: Goal: Ability to remain free from injury will improve Outcome: Progressing   

## 2022-10-12 NOTE — Anesthesia Procedure Notes (Signed)
Date/Time: 10/12/2022 7:26 AM  Performed by: Florene Route, CRNAOxygen Delivery Method: Simple face mask

## 2022-10-13 ENCOUNTER — Encounter (HOSPITAL_COMMUNITY): Payer: Self-pay | Admitting: Specialist

## 2022-10-13 DIAGNOSIS — Z791 Long term (current) use of non-steroidal anti-inflammatories (NSAID): Secondary | ICD-10-CM | POA: Diagnosis not present

## 2022-10-13 DIAGNOSIS — Z79899 Other long term (current) drug therapy: Secondary | ICD-10-CM | POA: Diagnosis not present

## 2022-10-13 DIAGNOSIS — M1711 Unilateral primary osteoarthritis, right knee: Secondary | ICD-10-CM | POA: Diagnosis not present

## 2022-10-13 DIAGNOSIS — I10 Essential (primary) hypertension: Secondary | ICD-10-CM | POA: Diagnosis not present

## 2022-10-13 DIAGNOSIS — M21161 Varus deformity, not elsewhere classified, right knee: Secondary | ICD-10-CM | POA: Diagnosis not present

## 2022-10-13 LAB — BASIC METABOLIC PANEL
Anion gap: 9 (ref 5–15)
BUN: 16 mg/dL (ref 8–23)
CO2: 25 mmol/L (ref 22–32)
Calcium: 8.4 mg/dL — ABNORMAL LOW (ref 8.9–10.3)
Chloride: 101 mmol/L (ref 98–111)
Creatinine, Ser: 0.82 mg/dL (ref 0.61–1.24)
GFR, Estimated: >60 mL/min (ref >60)
Glucose, Bld: 161 mg/dL — ABNORMAL HIGH (ref 70–99)
Potassium: 4.6 mmol/L (ref 3.5–5.1)
Sodium: 135 mmol/L (ref 135–145)

## 2022-10-13 LAB — CBC
Hemoglobin: 12.7 g/dL — ABNORMAL LOW (ref 13.0–17.0)
MCH: 32 pg (ref 26.0–34.0)
MCHC: 33.1 g/dL (ref 30.0–36.0)
MCV: 96.7 fL (ref 80.0–100.0)
RBC: 3.97 MIL/uL — ABNORMAL LOW (ref 4.22–5.81)
RDW: 13.2 % (ref 11.5–15.5)
WBC: 14.3 10*3/uL — ABNORMAL HIGH (ref 4.0–10.5)
nRBC: 0 % (ref 0.0–0.2)

## 2022-10-13 NOTE — Progress Notes (Signed)
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 Pt PMH includes but is not limited to: arthritis, skin Ca and HTN.    PT Comments  The patient reports feeling much better. Ambulated x 80'. Patient's BP  137/82 after ambulating. Patient hopeful to DC today. Will return for a second ambulation and see how he feels, how pain is controlled.     Assistance Recommended at Discharge Intermittent Supervision/Assistance  If plan is discharge home, recommend the following:  Can travel by private vehicle    A lot of help with walking and/or transfers;A lot of help with bathing/dressing/bathroom;Assistance with cooking/housework;Assist for transportation      Equipment Recommendations  None recommended by PT    Recommendations for Other Services       Precautions / Restrictions Precautions Precautions: Knee;Fall Precaution Comments: did not use KI Restrictions Weight Bearing Restrictions: No RLE Weight Bearing: Weight bearing as tolerated     Mobility  Bed Mobility Overal bed mobility: Needs Assistance Bed Mobility: Supine to Sit     Supine to sit: Min guard     General bed mobility comments: cues, increased time,    Transfers Overall transfer level: Needs assistance Equipment used: Rolling walker (2 wheels) Transfers: Sit to/from Stand Sit to Stand: Min guard           General transfer comment: cues for proper UE placement, and RLE position    Ambulation/Gait Ambulation/Gait assistance: Min assist Gait Distance (Feet): 80 Feet Assistive device: Rolling walker (2 wheels) Gait Pattern/deviations: Step-to pattern, Step-through pattern       General Gait Details: cues for sequence and poaition inside Rw   Stairs             Wheelchair Mobility     Tilt  Bed    Modified Rankin (Stroke Patients Only)       Balance Overall balance assessment: Needs assistance Sitting-balance support: Feet supported Sitting balance-Leahy Scale: Good     Standing balance support: Bilateral upper extremity supported, Reliant on assistive device for balance, During functional activity Standing balance-Leahy Scale: Good                              Cognition Arousal/Alertness: Awake/alert Behavior During Therapy: WFL for tasks assessed/performed Overall Cognitive Status: Within Functional Limits for tasks assessed                                          Exercises Total Joint Exercises Ankle Circles/Pumps: AROM, Both, 10 reps Quad Sets: AROM, Both, 10 reps Heel Slides: AAROM, 5 reps, Right Straight Leg Raises: AAROM, 10 reps, Right Long Arc Quad: AROM, Right, 10 reps Goniometric ROM: 10-70 knee flex R    General Comments        Pertinent Vitals/Pain Pain Assessment Pain Score: 6  Pain Location: R leg/knee Pain Descriptors / Indicators: Aching, Constant, Discomfort, Operative site guarding Pain Intervention(s): Monitored during session, Premedicated before session, Ice applied, Repositioned    Home Living                          Prior Function  PT Goals (current goals can now be found in the care plan section) Progress towards PT goals: Progressing toward goals    Frequency    7X/week      PT Plan Current plan remains appropriate    Co-evaluation              AM-PAC PT "6 Clicks" Mobility   Outcome Measure  Help needed turning from your back to your side while in a flat bed without using bedrails?: A Little Help needed moving from lying on your back to sitting on the side of a flat bed without using bedrails?: A Little Help needed moving to and from a bed to a chair (including a wheelchair)?: A Little Help needed standing up from a chair using your arms (e.g.,  wheelchair or bedside chair)?: A Little Help needed to walk in hospital room?: A Little Help needed climbing 3-5 steps with a railing? : A Lot 6 Click Score: 17    End of Session Equipment Utilized During Treatment: Gait belt Activity Tolerance: Patient tolerated treatment well Patient left: in chair;with call bell/phone within reach;with family/visitor present Nurse Communication: Mobility status;Other (comment);Patient requests pain meds PT Visit Diagnosis: Unsteadiness on feet (R26.81);Other abnormalities of gait and mobility (R26.89);Muscle weakness (generalized) (M62.81);Pain Pain - Right/Left: Right     Time: 0830-0900 PT Time Calculation (min) (ACUTE ONLY): 30 min  Charges:    $Gait Training: 8-22 mins $Therapeutic Exercise: 8-22 mins PT General Charges $$ ACUTE PT VISIT: 1 Visit                     Blanchard Kelch PT Acute Rehabilitation Services Office [phone] Weekend [phone]    Rada Hay 10/13/2022, 12:23 PM

## 2022-10-13 NOTE — Progress Notes (Signed)
Provided discharge education/instructions, all questions and concerns addressed. Pt not in any distress, discharged home with all of his belongings accompanied by his wife.

## 2022-10-13 NOTE — Plan of Care (Signed)
Slept well since last medication.  Wife at bedside.

## 2022-10-13 NOTE — Progress Notes (Signed)
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 Pt PMH includes but is not limited to: arthritis, skin Ca and HTN.    PT Comments  The patient reports pain improved with pain meds. Patient ready for Dc. Reviewed need for KI if knee  buckling or feels tight. Wife present, has had a TKA in past and is familiar with  precautions.Patient has met PT goals  for Dc home.      Assistance Recommended at Discharge Intermittent Supervision/Assistance  If plan is discharge home, recommend the following:  Can travel by private vehicle    A lot of help with walking and/or transfers;A lot of help with bathing/dressing/bathroom;Assistance with cooking/housework;Assist for transportation      Equipment Recommendations  None recommended by PT    Recommendations for Other Services       Precautions / Restrictions Precautions Precautions: Fall Precaution Comments: did not use KI, reviewed with pt and wife to apply if knee seems to buckle or is tightening up Restrictions RLE Weight Bearing: Weight bearing as tolerated     Mobility  Bed Mobility Overal bed mobility: Needs Assistance Bed Mobility: Supine to Sit     Supine to sit: Min guard     General bed mobility comments: in recliner    Transfers Overall transfer level: Needs assistance Equipment used: Rolling walker (2 wheels) Transfers: Sit to/from Stand Sit to Stand: Min guard           General transfer comment: cues for proper UE placement, and RLE position    Ambulation/Gait Ambulation/Gait assistance: Min guard Gait Distance (Feet): 80 Feet Assistive device: Rolling walker (2 wheels) Gait Pattern/deviations: Step-to pattern, Step-through pattern Gait velocity: decr     General Gait Details: cues for sequence and  poaition inside Rw   Stairs             Wheelchair Mobility     Tilt Bed    Modified Rankin (Stroke Patients Only)       Balance Overall balance assessment: Needs assistance Sitting-balance support: Feet supported Sitting balance-Leahy Scale: Good     Standing balance support: Bilateral upper extremity supported, Reliant on assistive device for balance, During functional activity Standing balance-Leahy Scale: Fair                              Cognition Arousal/Alertness: Awake/alert Behavior During Therapy: Impulsive, WFL for tasks assessed/performed Overall Cognitive Status: Within Functional Limits for tasks assessed                                 General Comments: relies on direction  for step by step        Exercises Total Joint Exercises Ankle Circles/Pumps: AROM, Both, 10 reps Quad Sets: AROM, Both, 10 reps Heel Slides: AAROM, 5 reps, Right Straight Leg Raises: AAROM, 10 reps, Right Long Arc Quad: AROM, Right, 10 reps Goniometric ROM: 10-70 knee flex R    General Comments        Pertinent Vitals/Pain Pain Assessment Pain Score: 6  Pain Location: R leg/knee Pain Descriptors / Indicators: Aching, Constant, Discomfort, Operative site guarding Pain Intervention(s): Monitored during session, Premedicated before session    Home Living  Prior Function            PT Goals (current goals can now be found in the care plan section) Progress towards PT goals: Progressing toward goals    Frequency    7X/week      PT Plan Current plan remains appropriate    Co-evaluation              AM-PAC PT "6 Clicks" Mobility   Outcome Measure  Help needed turning from your back to your side while in a flat bed without using bedrails?: A Little Help needed moving from lying on your back to sitting on the side of a flat bed without using bedrails?: A Little Help needed moving to and  from a bed to a chair (including a wheelchair)?: A Little Help needed standing up from a chair using your arms (e.g., wheelchair or bedside chair)?: A Little Help needed to walk in hospital room?: A Little Help needed climbing 3-5 steps with a railing? : A Lot 6 Click Score: 17    End of Session Equipment Utilized During Treatment: Gait belt Activity Tolerance: Patient tolerated treatment well Patient left: in chair;with call bell/phone within reach;with family/visitor present Nurse Communication: Mobility status;Other (comment);Patient requests pain meds PT Visit Diagnosis: Unsteadiness on feet (R26.81);Other abnormalities of gait and mobility (R26.89);Muscle weakness (generalized) (M62.81);Pain Pain - Right/Left: Right Pain - part of body: Knee;Leg     Time: 1345-1402 PT Time Calculation (min) (ACUTE ONLY): 17 min  Charges:    $Gait Training: 8-22 mins $ PT General Charges $$ ACUTE PT VISIT: 1 Visit                     Blanchard Kelch PT Acute Rehabilitation Services Office [phone] Weekend pager-[phone]    Rada Hay 10/13/2022, 2:06 PM

## 2022-10-13 NOTE — TOC Transition Note (Signed)
Transition of Care [facility]) - CM/SW Discharge Note  Patient Details  Name: Curtis Davis MRN: [id number] Date of Birth: 1945/08/14  [facility] CM/SW Contact:  Ewing Schlein, LCSW Phone Number: 10/13/2022, 10:26 AM  Clinical Narrative: Patient is expected to discharge home after working with PT. CSW met with patient and spouse to confirm discharge plan. Patient will go home with OPPT at [facility] with the first appointment scheduled for 10/17/22. Patient has a rolling walker and cane at home, so there are no DME needs at this time. TOC signing off.   Final next level of care: OP Rehab Barriers to Discharge: No Barriers Identified  Patient Goals and CMS Choice Choice offered to / list presented to : NA  Discharge Plan and Services Additional resources added to the After Visit Summary for          DME Arranged: N/A DME Agency: NA  Social Determinants of Health (SDOH) Interventions SDOH Screenings   Food Insecurity: No Food Insecurity (10/12/2022)  Housing: Low Risk  (10/12/2022)  Transportation Needs: No Transportation Needs (10/12/2022)  Utilities: Not At Risk (10/12/2022)  Tobacco Use: Low Risk  (10/12/2022)   Readmission Risk Interventions     No data to display

## 2022-10-13 NOTE — Discharge Summary (Signed)
Physician Discharge Summary   Patient ID: Curtis Davis MRN: [id number] DOB/AGE: [birth year] 77 y.o.  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   Weight as of this encounter: 93 kg.  Procedure:  Procedure(s) (LRB): TOTAL KNEE ARTHROPLASTY (Right)   Consults: None  HPI: see pre-op H&P Laboratory Data: Admission on 10/12/2022  Component Date Value Ref Range Status   WBC 10/13/2022 14.3 (H)  4.0 - 10.5 K/uL Final   RBC 10/13/2022 3.97 (L)  4.22 - 5.81 MIL/uL Final   Hemoglobin 10/13/2022 12.7 (L)  13.0 - 17.0 g/dL Final   HCT 78/29/5621 38.4 (L)  39.0 - 52.0 % Final   MCV 10/13/2022 96.7  80.0 - 100.0 fL Final   MCH 10/13/2022 32.0  26.0 - 34.0 pg Final   MCHC 10/13/2022 33.1  30.0 - 36.0 g/dL Final   RDW 30/86/5784 13.2  11.5 - 15.5 % Final   Platelets 10/13/2022 242  150 - 400 K/uL Final   nRBC 10/13/2022 0.0  0.0 - 0.2 % Final   Performed at [facility], [address], [place], [place] [address]   Sodium 10/13/2022 135  135 - 145 mmol/L Final   Potassium 10/13/2022 4.6  3.5 - 5.1 mmol/L Final   Chloride 10/13/2022 101  98 - 111 mmol/L Final   CO2 10/13/2022 25  22 - 32 mmol/L Final   Glucose, Bld 10/13/2022 161 (H)  70 - 99 mg/dL Final   Glucose reference range applies only to samples taken after fasting for at least 8 hours.   BUN 10/13/2022 16  8 - 23 mg/dL Final   Creatinine, Ser 10/13/2022 0.82  0.61 - 1.24 mg/dL Final   Calcium 52/84/1324 8.4 (L)  8.9 - 10.3 mg/dL Final   GFR, Estimated 10/13/2022 >60  >60 mL/min Final   Comment: (NOTE) Calculated using the CKD-EPI  Creatinine Equation (2021)    Anion gap 10/13/2022 9  5 - 15 Final   Performed at [facility], [address] [place], [place] [address]  Hospital Outpatient Visit on 10/04/2022  Component Date Value Ref Range Status   Sodium 10/04/2022 139  135 - 145 mmol/L Final   Potassium 10/04/2022 4.0  3.5 - 5.1 mmol/L Final   Chloride 10/04/2022 107  98 - 111 mmol/L Final   CO2 10/04/2022 26  22 - 32 mmol/L Final   Glucose, Bld 10/04/2022 112 (H)  70 - 99 mg/dL Final   Glucose reference range applies only to samples taken after fasting for at least 8 hours.   BUN 10/04/2022 14  8 - 23 mg/dL Final   Creatinine, Ser 10/04/2022 0.75  0.61 - 1.24 mg/dL Final   Calcium 72/53/6644 9.1  8.9 - 10.3 mg/dL Final   GFR, Estimated 10/04/2022 >60  >60 mL/min Final   Comment: (NOTE) Calculated using the CKD-EPI Creatinine Equation (2021)    Anion gap 10/04/2022 6  5 - 15 Final   Performed at [facility], [address] [place], [place] [address]   WBC 10/04/2022 6.3  4.0 - 10.5  K/uL Final   RBC 10/04/2022 4.64  4.22 - 5.81 MIL/uL Final   Hemoglobin 10/04/2022 14.5  13.0 - 17.0 g/dL Final   HCT 56/21/3086 44.0  39.0 - 52.0 % Final   MCV 10/04/2022 94.8  80.0 - 100.0 fL Final   MCH 10/04/2022 31.3  26.0 - 34.0 pg Final   MCHC 10/04/2022 33.0  30.0 - 36.0 g/dL Final   RDW 57/84/6962 13.4  11.5 - 15.5 % Final   Platelets 10/04/2022 274  150 - 400 K/uL Final   nRBC 10/04/2022 0.0  0.0 - 0.2 % Final   Performed at [facility], [address], [place], [place] [address]   MRSA, PCR 10/04/2022 NEGATIVE  NEGATIVE Final   Staphylococcus aureus 10/04/2022 NEGATIVE  NEGATIVE Final   Comment: (NOTE) The Xpert SA Assay (FDA approved for NASAL specimens in patients 32 years of age and older), is one component of a comprehensive surveillance program. It is not intended to diagnose infection nor to guide or monitor treatment. Performed at [facility], [address] [place], [place] [address]      X-Rays:DG Knee 1-2 Views Right  Result Date: 10/12/2022 CLINICAL DATA:  Status post right knee arthroplasty using cement. Postoperative. EXAM: RIGHT KNEE - 1-2 VIEW COMPARISON:  None Available. FINDINGS: Status post right knee arthroplasty. No perihardware lucency is seen to indicate hardware failure or loosening. Expected postoperative changes including small joint effusion, intra-articular air, and anterior subcutaneous air. No acute fracture or dislocation. IMPRESSION: Status post right knee arthroplasty. No evidence of hardware failure. Electronically Signed   By: Neita Garnet M.D.   On: 10/12/2022 11:00    EKG: Orders placed or performed during the hospital encounter of 10/04/22   EKG 12 lead per protocol   EKG 12 lead per protocol     Hospital Course: Curtis Davis is a 77 y.o. who was admitted to [facility]. They were brought to the operating room on 10/12/2022 and underwent Procedure(s): TOTAL KNEE ARTHROPLASTY.  Patient tolerated the procedure well and was later transferred to the recovery room and then to the orthopaedic floor for postoperative care.  They were given PO and IV analgesics for pain control following their surgery.  They were given 24 hours of postoperative antibiotics of  Anti-infectives (From admission, onward)    Start     Dose/Rate Route Frequency Ordered Stop   10/12/22 1330  ceFAZolin (ANCEF) IVPB 2g/100 mL premix        2 g 200 mL/hr over 30 Minutes Intravenous Every 6 hours 10/12/22 1040 10/12/22 2037   10/12/22 1233  ceFAZolin (ANCEF) 2-4 GM/100ML-% IVPB       Note to Pharmacy: Moishe Spice W: cabinet override      10/12/22 1233 10/13/22 0044   10/12/22 0600  ceFAZolin (ANCEF) IVPB 2g/100 mL premix        2 g 200 mL/hr over 30 Minutes Intravenous On call to O.R. 10/12/22 0102 10/12/22 0745      and started on DVT prophylaxis in the form of Aspirin, TED hose, and SCDs  .   PT and OT were ordered for total joint protocol.  Discharge planning consulted to help with postop disposition and equipment needs.  Patient had a good night on the evening of surgery.  They started to get up OOB with therapy on day one. Continued to work with therapy into day one. By day one, the patient had progressed with therapy and meeting their  goals.  Incision was healing well.  Patient was seen in rounds and was ready to go home.   Diet: Regular diet Activity:WBAT Follow-up:in 10-14 days Disposition - Home Discharged Condition: good    Allergies as of 10/13/2022       Reactions   Penicillins Other (See Comments)   Aggravated poison ivy reaction         Medication List     STOP taking these medications    atorvastatin 20 MG tablet Commonly known as: LIPITOR   Turmeric 450 MG Caps       TAKE these medications    alfuzosin 10 MG 24 hr tablet Commonly known as: UROXATRAL Take 10 mg by mouth daily.   amLODipine 5 MG tablet Commonly known as: NORVASC Take 5 mg by mouth daily.   aspirin EC 81 MG tablet Take 1 tablet (81 mg total) by mouth 2 (two) times daily after a meal. Day after surgery   docusate sodium 100 MG capsule Commonly known as: Colace Take 1 capsule (100 mg total) by mouth 2 (two) times daily as needed for mild constipation.   lisinopril 40 MG tablet Commonly known as: ZESTRIL Take 40 mg by mouth daily.   Lysine 500 MG Caps Take 500 mg by mouth daily.   meloxicam 7.5 MG tablet Commonly known as: MOBIC Take 7.5 mg by mouth daily.   methocarbamol 500 MG tablet Commonly known as: ROBAXIN Take 1 tablet (500 mg total) by mouth every 8 (eight) hours as needed for muscle spasms.   multivitamin with minerals tablet Take 1 tablet by mouth daily.   omeprazole 20 MG tablet Commonly known as: PRILOSEC OTC Take 20 mg by mouth daily.   oxyCODONE 5 MG immediate release tablet Commonly known as: Oxy IR/ROXICODONE Take 1-2 tablets (5-10 mg  total) by mouth every 4 (four) hours as needed for severe pain.   polyethylene glycol 17 g packet Commonly known as: MIRALAX / GLYCOLAX Take 17 g by mouth daily.   traMADol 50 MG tablet Commonly known as: ULTRAM Take 50 mg by mouth every 6 (six) hours as needed for severe pain.   zolpidem 5 MG tablet Commonly known as: AMBIEN Take 5 mg by mouth at bedtime as needed for sleep.        Follow-up Information     Jene Every, MD. Go on 10/25/2022.   Specialty: Orthopedic Surgery Why: You are scheduled for first post op appt on Tuesday August 6 at 8:30am. Contact information: [address] [place] [address] [phone]         Zorita Pang.. Go on 10/17/2022.   Why: You are scheduled for physical therapy eval on Monday July 29 at 2:30pm. Contact information: [address] [place] [address] [phone]                 Signed: Andrez Grime, PA-C Orthopaedic Surgery 10/13/2022, 1:11 PM

## 2022-10-13 NOTE — Progress Notes (Signed)
Subjective: 1 Day Post-Op Procedure(s) (LRB): TOTAL KNEE ARTHROPLASTY (Right) Patient reports pain as 4 on 0-10 scale.   Denies CP or SOB.  Voiding without difficulty. Positive flatus.Vomited multiple times overnight, none today. Nausea resolved. Feels ready to go home today. Objective: Vital signs in last 24 hours: Temp:  [97.5 F (36.4 C)-98.6 F (37 C)] 98 F (36.7 C) (07/25 0622) Pulse Rate:  [51-86] 65 (07/25 0622) Resp:  [11-18] 15 (07/25 0622) BP: (97-150)/(56-86) 97/56 (07/25 0622) SpO2:  [92 %-100 %] 96 % (07/25 0622)  Intake/Output from previous day: 07/24 0701 - 07/25 0700 In: 3883.3 [P.O.:1310; I.V.:2118.3; IV Piggyback:455] Out: 350 [Urine:300; Blood:50] Intake/Output this shift: Total I/O In: -  Out: 90 [Urine:90]  Recent Labs    10/13/22 0335  HGB 12.7*   Recent Labs    10/13/22 0335  WBC 14.3*  RBC 3.97*  HCT 38.4*  PLT 242   Recent Labs    10/13/22 0335  NA 135  K 4.6  CL 101  CO2 25  BUN 16  CREATININE 0.82  GLUCOSE 161*  CALCIUM 8.4*   No results for input(s): "LABPT", "INR" in the last 72 hours.  Neurologically intact Neurovascular intact Sensation intact distally Intact pulses distally Dorsiflexion/Plantar flexion intact Incision: dressing C/D/I No cellulitis present Compartment soft No DVT  Assessment/Plan:  1 Day Post-Op Procedure(s) (LRB): TOTAL KNEE ARTHROPLASTY (Right) Advance diet D/C home Outpt PT scheduled   Patient's anticipated LOS is less than 2 midnights, meeting these requirements: - Younger than 56 - Lives within 1 hour of care - Has a competent adult at home to recover with post-op recover - NO history of  - Chronic pain requiring opiods  - Diabetes  - Coronary Artery Disease  - Heart failure  - Heart attack  - Stroke  - DVT/VTE  - Cardiac arrhythmia  - Respiratory Failure/COPD  - Renal failure  - Anemia  - Advanced Liver disease      Principal Problem:   Right knee DJD      Javier Docker 10/13/2022, @[facility] 

## 2022-10-17 DIAGNOSIS — M25661 Stiffness of right knee, not elsewhere classified: Secondary | ICD-10-CM | POA: Diagnosis not present

## 2022-10-19 DIAGNOSIS — M25661 Stiffness of right knee, not elsewhere classified: Secondary | ICD-10-CM | POA: Diagnosis not present

## 2022-10-21 DIAGNOSIS — M25661 Stiffness of right knee, not elsewhere classified: Secondary | ICD-10-CM | POA: Diagnosis not present

## 2022-10-24 DIAGNOSIS — M25661 Stiffness of right knee, not elsewhere classified: Secondary | ICD-10-CM | POA: Diagnosis not present

## 2022-10-25 DIAGNOSIS — Z5189 Encounter for other specified aftercare: Secondary | ICD-10-CM | POA: Diagnosis not present

## 2022-10-26 DIAGNOSIS — M25661 Stiffness of right knee, not elsewhere classified: Secondary | ICD-10-CM | POA: Diagnosis not present

## 2022-10-28 DIAGNOSIS — M25661 Stiffness of right knee, not elsewhere classified: Secondary | ICD-10-CM | POA: Diagnosis not present

## 2022-11-01 DIAGNOSIS — M25661 Stiffness of right knee, not elsewhere classified: Secondary | ICD-10-CM | POA: Diagnosis not present

## 2022-11-04 DIAGNOSIS — M25661 Stiffness of right knee, not elsewhere classified: Secondary | ICD-10-CM | POA: Diagnosis not present

## 2022-11-08 DIAGNOSIS — M25661 Stiffness of right knee, not elsewhere classified: Secondary | ICD-10-CM | POA: Diagnosis not present

## 2022-11-11 DIAGNOSIS — M25661 Stiffness of right knee, not elsewhere classified: Secondary | ICD-10-CM | POA: Diagnosis not present

## 2022-11-15 DIAGNOSIS — M25661 Stiffness of right knee, not elsewhere classified: Secondary | ICD-10-CM | POA: Diagnosis not present

## 2022-11-17 DIAGNOSIS — M25661 Stiffness of right knee, not elsewhere classified: Secondary | ICD-10-CM | POA: Diagnosis not present

## 2022-11-22 DIAGNOSIS — Z96651 Presence of right artificial knee joint: Secondary | ICD-10-CM | POA: Diagnosis not present

## 2022-11-22 DIAGNOSIS — Z5189 Encounter for other specified aftercare: Secondary | ICD-10-CM | POA: Diagnosis not present

## 2022-11-22 DIAGNOSIS — M1712 Unilateral primary osteoarthritis, left knee: Secondary | ICD-10-CM | POA: Diagnosis not present

## 2022-11-25 DIAGNOSIS — Z23 Encounter for immunization: Secondary | ICD-10-CM | POA: Diagnosis not present

## 2022-11-30 ENCOUNTER — Ambulatory Visit: Payer: Self-pay | Admitting: Orthopedic Surgery

## 2022-12-07 NOTE — Progress Notes (Addendum)
Anesthesia Review:  PCP: Laurann Montana  Cardiologist : none  Chest x-ray : EKG : 10/04/22   Echo : Stress test: Cardiac Cath :  Activity level: can do a flight of stairs without difficutly  Sleep Study/ CPAP : none  Fasting Blood Sugar :      / Checks Blood Sugar -- times a day:   Blood Thinner/ Instructions /Last  Dose: ASA / Instructions/ Last Dose :    81 mg aspirin    7/24- Right TK  PT reports at preop on 12/12/22 that since his right knee surgery he has not slept all nite long since this surgery.  His PCP has placed him on sleep med which has not helped and pt reports that prior to right knee he had no problems sleeping all nite long.

## 2022-12-07 NOTE — Patient Instructions (Addendum)
SURGICAL WAITING ROOM VISITATION  Patients having surgery or a procedure may have no more than 2 support people in the waiting area - these visitors may rotate.    Children under the age of 98 must have an adult with them who is not the patient.  Due to an increase in RSV and influenza rates and associated hospitalizations, children ages 1 and under may not visit patients in [facility] hospitals.  If the patient needs to stay at the hospital during part of their recovery, the visitor guidelines for inpatient rooms apply. Pre-op nurse will coordinate an appropriate time for 1 support person to accompany patient in pre-op.  This support person may not rotate.    Please refer to the [facility] website for the visitor guidelines for Inpatients (after your surgery is over and you are in a regular room).       Your procedure is scheduled on:  12/21/2022    Report to [facility] Main Entrance    Report to admitting at  0915 AM   Call this number if you have problems the morning of surgery [phone]   Do not eat food :After Midnight.   After Midnight you may have the following liquids until __ 0845____ AM  DAY OF SURGERY  Water Non-Citrus Juices (without pulp, NO RED-Apple, White grape, White cranberry) Black Coffee (NO MILK/CREAM OR CREAMERS, sugar ok)  Clear Tea (NO MILK/CREAM OR CREAMERS, sugar ok) regular and decaf                             Plain Jell-O (NO RED)                                           Fruit ices (not with fruit pulp, NO RED)                                     Popsicles (NO RED)                                                               Sports drinks like Gatorade (NO RED)                    The day of surgery:  Drink ONE (1) Pre-Surgery Clear Ensure or G2 at   0845AM  ( have completed by ) the morning of surgery. Drink in one sitting. Do not sip.  This drink was given to you during your hospital  pre-op appointment visit. Nothing else to  drink after completing the  Pre-Surgery Clear Ensure or G2.          If you have questions, please contact your surgeon's office.        Oral Hygiene is also important to reduce your risk of infection.                                    Remember - BRUSH YOUR TEETH THE MORNING OF  SURGERY WITH YOUR REGULAR TOOTHPASTE  DENTURES WILL BE REMOVED PRIOR TO SURGERY PLEASE DO NOT APPLY "Poly grip" OR ADHESIVES!!!   Do NOT smoke after Midnight   Stop all vitamins and herbal supplements 7 days before surgery.   Take these medicines the morning of surgery with A SIP OF WATER:  uroxatral, amlodipine, omeprazole   DO NOT TAKE ANY ORAL DIABETIC MEDICATIONS DAY OF YOUR SURGERY  Bring CPAP mask and tubing day of surgery.                              You may not have any metal on your body including hair pins, jewelry, and body piercing             Do not wear make-up, lotions, powders, perfumes/cologne, or deodorant  Do not wear nail polish including gel and S&S, artificial/acrylic nails, or any other type of covering on natural nails including finger and toenails. If you have artificial nails, gel coating, etc. that needs to be removed by a nail salon please have this removed prior to surgery or surgery may need to be canceled/ delayed if the surgeon/ anesthesia feels like they are unable to be safely monitored.   Do not shave  48 hours prior to surgery.               Men may shave face and neck.   Do not bring valuables to the hospital.  IS NOT             RESPONSIBLE   FOR VALUABLES.   Contacts, glasses, dentures or bridgework may not be worn into surgery.   Bring small overnight bag day of surgery.   DO NOT BRING YOUR HOME MEDICATIONS TO THE HOSPITAL. PHARMACY WILL DISPENSE MEDICATIONS LISTED ON YOUR MEDICATION LIST TO YOU DURING YOUR ADMISSION IN THE HOSPITAL!    Patients discharged on the day of surgery will not be allowed to drive home.  Someone NEEDS to stay with you for  the first 24 hours after anesthesia.   Special Instructions: Bring a copy of your healthcare power of attorney and living will documents the day of surgery if you haven't scanned them before.              Please read over the following fact sheets you were given: IF YOU HAVE QUESTIONS ABOUT YOUR PRE-OP INSTRUCTIONS PLEASE CALL [phone]   If you received a COVID test during your pre-op visit  it is requested that you wear a mask when out in public, stay away from anyone that may not be feeling well and notify your surgeon if you develop symptoms. If you test positive for Covid or have been in contact with anyone that has tested positive in the last 10 days please notify you surgeon.      Pre-operative 5 CHG Bath Instructions   You can play a key role in reducing the risk of infection after surgery. Your skin needs to be as free of germs as possible. You can reduce the number of germs on your skin by washing with CHG (chlorhexidine gluconate) soap before surgery. CHG is an antiseptic soap that kills germs and continues to kill germs even after washing.   DO NOT use if you have an allergy to chlorhexidine/CHG or antibacterial soaps. If your skin becomes reddened or irritated, stop using the CHG and notify one of our RNs at [phone].   Please shower with  the CHG soap starting 4 days before surgery using the following schedule:     Please keep in mind the following:  DO NOT shave, including legs and underarms, starting the day of your first shower.   You may shave your face at any point before/day of surgery.  Place clean sheets on your bed the day you start using CHG soap. Use a clean washcloth (not used since being washed) for each shower. DO NOT sleep with pets once you start using the CHG.   CHG Shower Instructions:  If you choose to wash your hair and private area, wash first with your normal shampoo/soap.  After you use shampoo/soap, rinse your hair and body thoroughly to  remove shampoo/soap residue.  Turn the water OFF and apply about 3 tablespoons (45 ml) of CHG soap to a CLEAN washcloth.  Apply CHG soap ONLY FROM YOUR NECK DOWN TO YOUR TOES (washing for 3-5 minutes)  DO NOT use CHG soap on face, private areas, open wounds, or sores.  Pay special attention to the area where your surgery is being performed.  If you are having back surgery, having someone wash your back for you may be helpful. Wait 2 minutes after CHG soap is applied, then you may rinse off the CHG soap.  Pat dry with a clean towel  Put on clean clothes/pajamas   If you choose to wear lotion, please use ONLY the CHG-compatible lotions on the back of this paper.     Additional instructions for the day of surgery: DO NOT APPLY any lotions, deodorants, cologne, or perfumes.   Put on clean/comfortable clothes.  Brush your teeth.  Ask your nurse before applying any prescription medications to the skin.      CHG Compatible Lotions   Aveeno Moisturizing lotion  Cetaphil Moisturizing Cream  Cetaphil Moisturizing Lotion  Clairol Herbal Essence Moisturizing Lotion, Dry Skin  Clairol Herbal Essence Moisturizing Lotion, Extra Dry Skin  Clairol Herbal Essence Moisturizing Lotion, Normal Skin  Curel Age Defying Therapeutic Moisturizing Lotion with Alpha Hydroxy  Curel Extreme Care Body Lotion  Curel Soothing Hands Moisturizing Hand Lotion  Curel Therapeutic Moisturizing Cream, Fragrance-Free  Curel Therapeutic Moisturizing Lotion, Fragrance-Free  Curel Therapeutic Moisturizing Lotion, Original Formula  Eucerin Daily Replenishing Lotion  Eucerin Dry Skin Therapy Plus Alpha Hydroxy Crme  Eucerin Dry Skin Therapy Plus Alpha Hydroxy Lotion  Eucerin Original Crme  Eucerin Original Lotion  Eucerin Plus Crme Eucerin Plus Lotion  Eucerin TriLipid Replenishing Lotion  Keri Anti-Bacterial Hand Lotion  Keri Deep Conditioning Original Lotion Dry Skin Formula Softly Scented  Keri Deep Conditioning  Original Lotion, Fragrance Free Sensitive Skin Formula  Keri Lotion Fast Absorbing Fragrance Free Sensitive Skin Formula  Keri Lotion Fast Absorbing Softly Scented Dry Skin Formula  Keri Original Lotion  Keri Skin Renewal Lotion Keri Silky Smooth Lotion  Keri Silky Smooth Sensitive Skin Lotion  Nivea Body Creamy Conditioning Oil  Nivea Body Extra Enriched Teacher, adult education Moisturizing Lotion Nivea Crme  Nivea Skin Firming Lotion  NutraDerm 30 Skin Lotion  NutraDerm Skin Lotion  NutraDerm Therapeutic Skin Cream  NutraDerm Therapeutic Skin Lotion  ProShield Protective Hand Cream  Provon moisturizing lotion

## 2022-12-09 DIAGNOSIS — Z23 Encounter for immunization: Secondary | ICD-10-CM | POA: Diagnosis not present

## 2022-12-12 ENCOUNTER — Encounter (HOSPITAL_COMMUNITY): Payer: Self-pay

## 2022-12-12 ENCOUNTER — Other Ambulatory Visit: Payer: Self-pay

## 2022-12-12 ENCOUNTER — Ambulatory Visit: Payer: Self-pay | Admitting: Orthopedic Surgery

## 2022-12-12 ENCOUNTER — Encounter (HOSPITAL_COMMUNITY)
Admission: RE | Admit: 2022-12-12 | Discharge: 2022-12-12 | Disposition: A | Discharge status: Home / Self Care | Payer: Medicare Other | Source: Ambulatory Visit | Attending: Specialist | Admitting: Specialist

## 2022-12-12 VITALS — BP 125/81 | HR 59 | Temp 98.2°F | Resp 16 | Ht 64.5 in | Wt 202.0 lb

## 2022-12-12 DIAGNOSIS — Z01812 Encounter for preprocedural laboratory examination: Secondary | ICD-10-CM | POA: Diagnosis not present

## 2022-12-12 DIAGNOSIS — Z01818 Encounter for other preprocedural examination: Secondary | ICD-10-CM | POA: Diagnosis present

## 2022-12-12 HISTORY — DX: Other specified postprocedural states: Z98.890

## 2022-12-12 HISTORY — DX: Gastro-esophageal reflux disease without esophagitis: K21.9

## 2022-12-12 LAB — CBC
HCT: 38.9 % — ABNORMAL LOW (ref 39.0–52.0)
Hemoglobin: 12.6 g/dL — ABNORMAL LOW (ref 13.0–17.0)
MCH: 30.3 pg (ref 26.0–34.0)
MCHC: 32.4 g/dL (ref 30.0–36.0)
MCV: 93.5 fL (ref 80.0–100.0)
Platelets: 307 10*3/uL (ref 150–400)
RBC: 4.16 MIL/uL — ABNORMAL LOW (ref 4.22–5.81)
RDW: 13.3 % (ref 11.5–15.5)
WBC: 6.5 10*3/uL (ref 4.0–10.5)
nRBC: 0 % (ref 0.0–0.2)

## 2022-12-12 LAB — SURGICAL PCR SCREEN
MRSA, PCR: NEGATIVE
Staphylococcus aureus: NEGATIVE

## 2022-12-12 LAB — BASIC METABOLIC PANEL
Anion gap: 7 (ref 5–15)
BUN: 12 mg/dL (ref 8–23)
CO2: 25 mmol/L (ref 22–32)
Calcium: 8.7 mg/dL — ABNORMAL LOW (ref 8.9–10.3)
Chloride: 104 mmol/L (ref 98–111)
Creatinine, Ser: 0.7 mg/dL (ref 0.61–1.24)
GFR, Estimated: >60 mL/min (ref >60)
Glucose, Bld: 101 mg/dL — ABNORMAL HIGH (ref 70–99)
Potassium: 3.9 mmol/L (ref 3.5–5.1)
Sodium: 136 mmol/L (ref 135–145)

## 2022-12-12 NOTE — H&P (View-Only) (Signed)
Curtis Davis is an 77 y.o. male.   Chief Complaint: left knee pain HPI: H&P left TKA @ [facility] scheduled on 12/21/22.  Dr. Shelle Iron and the patient mutually agreed to proceed with a total knee replacement. Risks and benefits of the procedure were discussed including stiffness, suboptimal range of motion, persistent pain, infection requiring removal of prosthesis and reinsertion, need for prophylactic antibiotics in the future, for example, dental procedures, possible need for manipulation, revision in the future and also anesthetic complications including DVT, PE, etc. We discussed the perioperative course, time in the hospital, postoperative recovery and the need for elevation to control swelling. We also discussed the predicted range of motion and the probability that squatting and kneeling would be unobtainable in the future. In addition, postoperative anticoagulation was discussed. We have obtained preoperative medical clearance as necessary. Provided illustrated handout and discussed it in detail. They will enroll in the total joint replacement educational forum at the hospital.  Oxycodone and Dilaudid both did not seem to fully alleviate his pain and seem to keep him up at night, reports restlessness in the leg postoperatively. 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 Social History:  reports that he has never smoked. He has never used smokeless tobacco. He reports that he does not currently use  alcohol. He reports that he does not use drugs.  Allergies:  Allergies  Allergen Reactions   Penicillins Other (See Comments)    Aggravated poison ivy reaction     Current meds: alfuzosin ER 10 mg tablet,extended release 24 hr amLODIPine 5 mg tablet aspirin 81 mg tablet,delayed release atorvastatin 20 mg tablet lisinopriL 40 mg tablet lysine 500 mg tablet multivitamin omeprazole turmeric 450 mg-turmeric root extract 50 mg capsule   Results for orders placed or performed during the hospital encounter of 12/12/22 (from the past 48 hour(s))  Surgical pcr screen     Status: None   Collection Time: 12/12/22  8:25 AM   Specimen: Nasal Mucosa; Nasal Swab  Result Value Ref Range   MRSA, PCR NEGATIVE NEGATIVE   Staphylococcus aureus NEGATIVE NEGATIVE    Comment: (NOTE) The Xpert SA Assay (FDA approved for NASAL specimens in patients 50 years of age and older), is one component of a comprehensive surveillance program. It is not intended to diagnose infection nor to guide or monitor treatment. Performed at [facility], [address] [place], [place] [address]   Basic metabolic panel per protocol     Status: Abnormal   Collection Time: 12/12/22  8:25 AM  Result Value Ref Range   Sodium 136 135 - 145 mmol/L   Potassium 3.9 3.5 - 5.1 mmol/L   Chloride 104 98 - 111 mmol/L   CO2 25 22 - 32 mmol/L   Glucose, Bld 101 (H) 70 - 99 mg/dL    Comment: Glucose reference range applies only to samples taken after fasting for at least 8 hours.  BUN 12 8 - 23 mg/dL   Creatinine, Ser 1.61 0.61 - 1.24 mg/dL   Calcium 8.7 (L) 8.9 - 10.3 mg/dL   GFR, Estimated >09 >60 mL/min    Comment: (NOTE) Calculated using the CKD-EPI Creatinine Equation (2021)    Anion gap 7 5 - 15    Comment: Performed at [facility], [address], [place], [place] [address]  CBC per protocol     Status: Abnormal   Collection Time: 12/12/22  8:25 AM  Result Value Ref Range    WBC 6.5 4.0 - 10.5 K/uL   RBC 4.16 (L) 4.22 - 5.81 MIL/uL   Hemoglobin 12.6 (L) 13.0 - 17.0 g/dL   HCT 81.1 (L) 91.4 - 78.2 %   MCV 93.5 80.0 - 100.0 fL   MCH 30.3 26.0 - 34.0 pg   MCHC 32.4 30.0 - 36.0 g/dL   RDW 95.6 21.3 - 08.6 %   Platelets 307 150 - 400 K/uL   nRBC 0.0 0.0 - 0.2 %    Comment: Performed at [facility], [address], [place], [place] [address]   No results found.  Review of Systems  Constitutional: Negative.   HENT: Negative.    Eyes: Negative.   Respiratory: Negative.    Cardiovascular: Negative.   Gastrointestinal: Negative.   Endocrine: Negative.   Genitourinary: Negative.   Musculoskeletal:  Positive for arthralgias, gait problem, joint swelling and myalgias.  Allergic/Immunologic: Negative.   Hematological: Negative.   Psychiatric/Behavioral: Negative.      There were no vitals taken for this visit. Physical Exam Constitutional:      Appearance: Normal appearance.  HENT:     Head: Normocephalic and atraumatic.     Right Ear: External ear normal.     Left Ear: External ear normal.     Nose: Nose normal.     Mouth/Throat:     Pharynx: Oropharynx is clear.  Eyes:     Conjunctiva/sclera: Conjunctivae normal.  Cardiovascular:     Rate and Rhythm: Normal rate and regular rhythm.     Pulses: Normal pulses.     Heart sounds: Normal heart sounds.  Pulmonary:     Effort: Pulmonary effort is normal.     Breath sounds: Normal breath sounds.  Abdominal:     General: Bowel sounds are normal.  Musculoskeletal:     Cervical back: Normal range of motion and neck supple.     Comments: Left knee varus deformity tender medial joint line patellofemoral pain compression ranges -5-1 10. No DVT ipsilateral hip and ankle exams unremarkable  Neurological:     Mental Status: He is alert.      Assessment/Plan Impression: End-stage left knee osteoarthritis  Plan: Pt with end-stage left knee DJD, bone-on-bone, refractory to conservative  tx, scheduled for left total knee replacement by Dr. Shelle Iron on October 2. We again discussed the procedure itself as well as risks, complications and alternatives, including but not limited to DVT, PE, infx, bleeding, failure of procedure, need for secondary procedure including manipulation, nerve injury, ongoing pain/symptoms, anesthesia risk, even stroke or death. Also discussed typical post-op protocols, activity restrictions, need for PT, flexion/extension exercises, time out of work. Discussed need for DVT ppx post-op per protocol. Discussed dental ppx and infx prevention. Also discussed limitations post-operatively such as kneeling and squatting. All questions were answered. Patient desires to proceed with surgery as scheduled.  Will hold supplements, ASA and NSAIDs accordingly. Will remain NPO after midnight the night before surgery.  Will present to [facility] for pre-op testing. Anticipate hospital stay to include at least 2 midnights given medical history and to ensure proper pain control. Plan aspirin for DVT ppx post-op. Plan gabapentin and Toradol, oxycodone versus Dilaudid, Robaxin, Colace, Miralax. Plan discharge home post-op with family members at home for assistance, will start outpatient PT the week after surgery. Will follow up 10-14 days post-op for suture removal and xrays.  Plan left total knee replacement  Dorothy Spark, PA-C for Dr Shelle Iron 12/12/2022, 2:21 PM

## 2022-12-12 NOTE — H&P (Signed)
Curtis Davis is an 77 y.o. male.   Chief Complaint: left knee pain HPI: H&P left TKA @ [facility] scheduled on 12/21/22.  Dr. Shelle Iron and the patient mutually agreed to proceed with a total knee replacement. Risks and benefits of the procedure were discussed including stiffness, suboptimal range of motion, persistent pain, infection requiring removal of prosthesis and reinsertion, need for prophylactic antibiotics in the future, for example, dental procedures, possible need for manipulation, revision in the future and also anesthetic complications including DVT, PE, etc. We discussed the perioperative course, time in the hospital, postoperative recovery and the need for elevation to control swelling. We also discussed the predicted range of motion and the probability that squatting and kneeling would be unobtainable in the future. In addition, postoperative anticoagulation was discussed. We have obtained preoperative medical clearance as necessary. Provided illustrated handout and discussed it in detail. They will enroll in the total joint replacement educational forum at the hospital.  Oxycodone and Dilaudid both did not seem to fully alleviate his pain and seem to keep him up at night, reports restlessness in the leg postoperatively. 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 Social History:  reports that he has never smoked. He has never used smokeless tobacco. He reports that he does not currently use  alcohol. He reports that he does not use drugs.  Allergies:  Allergies  Allergen Reactions   Penicillins Other (See Comments)    Aggravated poison ivy reaction     Current meds: alfuzosin ER 10 mg tablet,extended release 24 hr amLODIPine 5 mg tablet aspirin 81 mg tablet,delayed release atorvastatin 20 mg tablet lisinopriL 40 mg tablet lysine 500 mg tablet multivitamin omeprazole turmeric 450 mg-turmeric root extract 50 mg capsule   Results for orders placed or performed during the hospital encounter of 12/12/22 (from the past 48 hour(s))  Surgical pcr screen     Status: None   Collection Time: 12/12/22  8:25 AM   Specimen: Nasal Mucosa; Nasal Swab  Result Value Ref Range   MRSA, PCR NEGATIVE NEGATIVE   Staphylococcus aureus NEGATIVE NEGATIVE    Comment: (NOTE) The Xpert SA Assay (FDA approved for NASAL specimens in patients 50 years of age and older), is one component of a comprehensive surveillance program. It is not intended to diagnose infection nor to guide or monitor treatment. Performed at [facility], [address] [place], [place] [address]   Basic metabolic panel per protocol     Status: Abnormal   Collection Time: 12/12/22  8:25 AM  Result Value Ref Range   Sodium 136 135 - 145 mmol/L   Potassium 3.9 3.5 - 5.1 mmol/L   Chloride 104 98 - 111 mmol/L   CO2 25 22 - 32 mmol/L   Glucose, Bld 101 (H) 70 - 99 mg/dL    Comment: Glucose reference range applies only to samples taken after fasting for at least 8 hours.  BUN 12 8 - 23 mg/dL   Creatinine, Ser 1.61 0.61 - 1.24 mg/dL   Calcium 8.7 (L) 8.9 - 10.3 mg/dL   GFR, Estimated >09 >60 mL/min    Comment: (NOTE) Calculated using the CKD-EPI Creatinine Equation (2021)    Anion gap 7 5 - 15    Comment: Performed at [facility], [address], [place], [place] [address]  CBC per protocol     Status: Abnormal   Collection Time: 12/12/22  8:25 AM  Result Value Ref Range    WBC 6.5 4.0 - 10.5 K/uL   RBC 4.16 (L) 4.22 - 5.81 MIL/uL   Hemoglobin 12.6 (L) 13.0 - 17.0 g/dL   HCT 81.1 (L) 91.4 - 78.2 %   MCV 93.5 80.0 - 100.0 fL   MCH 30.3 26.0 - 34.0 pg   MCHC 32.4 30.0 - 36.0 g/dL   RDW 95.6 21.3 - 08.6 %   Platelets 307 150 - 400 K/uL   nRBC 0.0 0.0 - 0.2 %    Comment: Performed at [facility], [address], [place], [place] [address]   No results found.  Review of Systems  Constitutional: Negative.   HENT: Negative.    Eyes: Negative.   Respiratory: Negative.    Cardiovascular: Negative.   Gastrointestinal: Negative.   Endocrine: Negative.   Genitourinary: Negative.   Musculoskeletal:  Positive for arthralgias, gait problem, joint swelling and myalgias.  Allergic/Immunologic: Negative.   Hematological: Negative.   Psychiatric/Behavioral: Negative.      There were no vitals taken for this visit. Physical Exam Constitutional:      Appearance: Normal appearance.  HENT:     Head: Normocephalic and atraumatic.     Right Ear: External ear normal.     Left Ear: External ear normal.     Nose: Nose normal.     Mouth/Throat:     Pharynx: Oropharynx is clear.  Eyes:     Conjunctiva/sclera: Conjunctivae normal.  Cardiovascular:     Rate and Rhythm: Normal rate and regular rhythm.     Pulses: Normal pulses.     Heart sounds: Normal heart sounds.  Pulmonary:     Effort: Pulmonary effort is normal.     Breath sounds: Normal breath sounds.  Abdominal:     General: Bowel sounds are normal.  Musculoskeletal:     Cervical back: Normal range of motion and neck supple.     Comments: Left knee varus deformity tender medial joint line patellofemoral pain compression ranges -5-1 10. No DVT ipsilateral hip and ankle exams unremarkable  Neurological:     Mental Status: He is alert.      Assessment/Plan Impression: End-stage left knee osteoarthritis  Plan: Pt with end-stage left knee DJD, bone-on-bone, refractory to conservative  tx, scheduled for left total knee replacement by Dr. Shelle Iron on October 2. We again discussed the procedure itself as well as risks, complications and alternatives, including but not limited to DVT, PE, infx, bleeding, failure of procedure, need for secondary procedure including manipulation, nerve injury, ongoing pain/symptoms, anesthesia risk, even stroke or death. Also discussed typical post-op protocols, activity restrictions, need for PT, flexion/extension exercises, time out of work. Discussed need for DVT ppx post-op per protocol. Discussed dental ppx and infx prevention. Also discussed limitations post-operatively such as kneeling and squatting. All questions were answered. Patient desires to proceed with surgery as scheduled.  Will hold supplements, ASA and NSAIDs accordingly. Will remain NPO after midnight the night before surgery.  Will present to [facility] for pre-op testing. Anticipate hospital stay to include at least 2 midnights given medical history and to ensure proper pain control. Plan aspirin for DVT ppx post-op. Plan gabapentin and Toradol, oxycodone versus Dilaudid, Robaxin, Colace, Miralax. Plan discharge home post-op with family members at home for assistance, will start outpatient PT the week after surgery. Will follow up 10-14 days post-op for suture removal and xrays.  Plan left total knee replacement  Dorothy Spark, PA-C for Dr Shelle Iron 12/12/2022, 2:21 PM

## 2022-12-21 ENCOUNTER — Ambulatory Visit (HOSPITAL_COMMUNITY): Payer: Medicare Other

## 2022-12-21 ENCOUNTER — Encounter (HOSPITAL_COMMUNITY)
Admission: RE | Disposition: A | Discharge status: Home / Self Care | Payer: Self-pay | Source: Ambulatory Visit | Attending: Specialist

## 2022-12-21 ENCOUNTER — Encounter (HOSPITAL_COMMUNITY): Payer: Self-pay | Admitting: Specialist

## 2022-12-21 ENCOUNTER — Ambulatory Visit (HOSPITAL_COMMUNITY): Payer: Medicare Other | Admitting: Physician Assistant

## 2022-12-21 ENCOUNTER — Ambulatory Visit (HOSPITAL_COMMUNITY)
Admission: RE | Admit: 2022-12-21 | Discharge: 2022-12-22 | Disposition: A | Discharge status: Home / Self Care | Payer: Medicare Other | Source: Ambulatory Visit | Attending: Specialist | Admitting: Specialist

## 2022-12-21 ENCOUNTER — Ambulatory Visit (HOSPITAL_COMMUNITY): Payer: Medicare Other | Admitting: Certified Registered Nurse Anesthetist

## 2022-12-21 ENCOUNTER — Other Ambulatory Visit: Payer: Self-pay

## 2022-12-21 DIAGNOSIS — M1712 Unilateral primary osteoarthritis, left knee: Secondary | ICD-10-CM

## 2022-12-21 DIAGNOSIS — Z79899 Other long term (current) drug therapy: Secondary | ICD-10-CM | POA: Diagnosis not present

## 2022-12-21 DIAGNOSIS — M21162 Varus deformity, not elsewhere classified, left knee: Secondary | ICD-10-CM | POA: Insufficient documentation

## 2022-12-21 DIAGNOSIS — Z471 Aftercare following joint replacement surgery: Secondary | ICD-10-CM | POA: Diagnosis not present

## 2022-12-21 DIAGNOSIS — K219 Gastro-esophageal reflux disease without esophagitis: Secondary | ICD-10-CM | POA: Insufficient documentation

## 2022-12-21 DIAGNOSIS — I10 Essential (primary) hypertension: Secondary | ICD-10-CM | POA: Insufficient documentation

## 2022-12-21 DIAGNOSIS — Z96652 Presence of left artificial knee joint: Secondary | ICD-10-CM | POA: Diagnosis not present

## 2022-12-21 DIAGNOSIS — G8918 Other acute postprocedural pain: Secondary | ICD-10-CM | POA: Diagnosis not present

## 2022-12-21 DIAGNOSIS — Z01818 Encounter for other preprocedural examination: Secondary | ICD-10-CM

## 2022-12-21 HISTORY — PX: TOTAL KNEE ARTHROPLASTY: SHX125

## 2022-12-21 SURGERY — ARTHROPLASTY, KNEE, TOTAL
Anesthesia: Regional | Site: Knee | Laterality: Left

## 2022-12-21 MED ORDER — AMISULPRIDE (ANTIEMETIC) 5 MG/2ML IV SOLN: 10.0000 mg | Freq: Once | INTRAVENOUS | Status: DC | PRN

## 2022-12-21 MED ORDER — TRANEXAMIC ACID-NACL 1000-0.7 MG/100ML-% IV SOLN
1000.0000 mg | INTRAVENOUS | Status: AC
  Administered 2022-12-21: 1000 mg via INTRAVENOUS
  Filled 2022-12-21: qty 100

## 2022-12-21 MED ORDER — CEFAZOLIN SODIUM-DEXTROSE 2-4 GM/100ML-% IV SOLN
2.0000 g | INTRAVENOUS | Status: AC
  Administered 2022-12-21: 2 g via INTRAVENOUS
  Filled 2022-12-21: qty 100

## 2022-12-21 MED ORDER — PHENYLEPHRINE HCL-NACL 20-0.9 MG/250ML-% IV SOLN
INTRAVENOUS | Status: DC | PRN
  Administered 2022-12-21: 25 ug/min via INTRAVENOUS

## 2022-12-21 MED ORDER — ORAL CARE MOUTH RINSE: 15.0000 mL | Freq: Once | OROMUCOSAL | Status: AC

## 2022-12-21 MED ORDER — DOCUSATE SODIUM 100 MG PO CAPS
100.0000 mg | ORAL_CAPSULE | Freq: Two times a day (BID) | ORAL | Status: DC
  Administered 2022-12-21 – 2022-12-22 (×2): 100 mg via ORAL
  Filled 2022-12-21 (×2): qty 1

## 2022-12-21 MED ORDER — PROPOFOL 1000 MG/100ML IV EMUL
INTRAVENOUS | Status: AC
  Filled 2022-12-21: qty 100

## 2022-12-21 MED ORDER — FENTANYL CITRATE PF 50 MCG/ML IJ SOSY: 25.0000 ug | PREFILLED_SYRINGE | INTRAMUSCULAR | Status: DC

## 2022-12-21 MED ORDER — ONDANSETRON HCL 4 MG PO TABS: 4.0000 mg | ORAL_TABLET | Freq: Four times a day (QID) | ORAL | Status: DC | PRN

## 2022-12-21 MED ORDER — MIDAZOLAM HCL 2 MG/2ML IJ SOLN
0.5000 mg | INTRAMUSCULAR | Status: DC
  Administered 2022-12-21: 1 mg via INTRAVENOUS
  Filled 2022-12-21 (×2): qty 2

## 2022-12-21 MED ORDER — ACETAMINOPHEN 500 MG PO TABS
1000.0000 mg | ORAL_TABLET | Freq: Four times a day (QID) | ORAL | Status: AC
  Administered 2022-12-21 – 2022-12-22 (×4): 1000 mg via ORAL
  Filled 2022-12-21 (×4): qty 2

## 2022-12-21 MED ORDER — HYDROMORPHONE HCL 1 MG/ML IJ SOLN
0.5000 mg | INTRAMUSCULAR | Status: DC | PRN
  Administered 2022-12-21: 0.5 mg via INTRAVENOUS
  Administered 2022-12-21: 1 mg via INTRAVENOUS
  Administered 2022-12-21: 0.5 mg via INTRAVENOUS
  Administered 2022-12-22: 1 mg via INTRAVENOUS
  Filled 2022-12-21 (×4): qty 1

## 2022-12-21 MED ORDER — AMLODIPINE BESYLATE 5 MG PO TABS
5.0000 mg | ORAL_TABLET | Freq: Every day | ORAL | Status: DC
  Administered 2022-12-22: 5 mg via ORAL
  Filled 2022-12-21: qty 1

## 2022-12-21 MED ORDER — AMISULPRIDE (ANTIEMETIC) 5 MG/2ML IV SOLN
INTRAVENOUS | Status: DC | PRN
  Administered 2022-12-21: 5 mg via INTRAVENOUS

## 2022-12-21 MED ORDER — CEFAZOLIN SODIUM-DEXTROSE 2-4 GM/100ML-% IV SOLN
2.0000 g | Freq: Four times a day (QID) | INTRAVENOUS | Status: AC
  Administered 2022-12-21 (×2): 2 g via INTRAVENOUS
  Filled 2022-12-21 (×2): qty 100

## 2022-12-21 MED ORDER — ALUM & MAG HYDROXIDE-SIMETH 200-200-20 MG/5ML PO SUSP: 30.0000 mL | ORAL | Status: DC | PRN

## 2022-12-21 MED ORDER — ACETAMINOPHEN 325 MG PO TABS: 325.0000 mg | ORAL_TABLET | Freq: Four times a day (QID) | ORAL | Status: DC | PRN

## 2022-12-21 MED ORDER — PROPOFOL 500 MG/50ML IV EMUL
INTRAVENOUS | Status: DC | PRN
  Administered 2022-12-21: 75 ug/kg/min via INTRAVENOUS

## 2022-12-21 MED ORDER — POLYETHYLENE GLYCOL 3350 17 G PO PACK: 17.0000 g | PACK | Freq: Every day | ORAL | Status: DC

## 2022-12-21 MED ORDER — CHLORHEXIDINE GLUCONATE 0.12 % MT SOLN
15.0000 mL | Freq: Once | OROMUCOSAL | Status: AC
  Administered 2022-12-21: 15 mL via OROMUCOSAL

## 2022-12-21 MED ORDER — EPHEDRINE 5 MG/ML INJ
INTRAVENOUS | Status: AC
  Filled 2022-12-21: qty 5

## 2022-12-21 MED ORDER — RISAQUAD PO CAPS
1.0000 | ORAL_CAPSULE | Freq: Every day | ORAL | Status: DC
  Administered 2022-12-22: 1 via ORAL
  Filled 2022-12-21: qty 1

## 2022-12-21 MED ORDER — OXYCODONE HCL 5 MG PO TABS
5.0000 mg | ORAL_TABLET | ORAL | Status: DC | PRN
  Administered 2022-12-21 – 2022-12-22 (×2): 10 mg via ORAL
  Filled 2022-12-21 (×2): qty 2

## 2022-12-21 MED ORDER — DEXAMETHASONE SODIUM PHOSPHATE 10 MG/ML IJ SOLN
INTRAMUSCULAR | Status: DC | PRN
Start: 2022-12-21 — End: 2022-12-21
  Administered 2022-12-21: 10 mg via INTRAVENOUS

## 2022-12-21 MED ORDER — BUPIVACAINE IN DEXTROSE 0.75-8.25 % IT SOLN
INTRATHECAL | Status: DC | PRN
Start: 2022-12-21 — End: 2022-12-21
  Administered 2022-12-21: 1.8 mL via INTRATHECAL

## 2022-12-21 MED ORDER — BISACODYL 5 MG PO TBEC: 5.0000 mg | DELAYED_RELEASE_TABLET | Freq: Every day | ORAL | Status: DC | PRN

## 2022-12-21 MED ORDER — PHENOL 1.4 % MT LIQD: 1.0000 | OROMUCOSAL | Status: DC | PRN

## 2022-12-21 MED ORDER — SODIUM CHLORIDE (PF) 0.9 % IJ SOLN
INTRAMUSCULAR | Status: DC | PRN
  Administered 2022-12-21: 60 mL

## 2022-12-21 MED ORDER — ACETAMINOPHEN 10 MG/ML IV SOLN
1000.0000 mg | INTRAVENOUS | Status: AC
  Administered 2022-12-21: 1000 mg via INTRAVENOUS
  Filled 2022-12-21: qty 100

## 2022-12-21 MED ORDER — OXYCODONE HCL 5 MG PO TABS
10.0000 mg | ORAL_TABLET | ORAL | Status: DC | PRN
  Administered 2022-12-21: 10 mg via ORAL
  Administered 2022-12-22 (×3): 15 mg via ORAL
  Filled 2022-12-21 (×2): qty 3
  Filled 2022-12-21: qty 2
  Filled 2022-12-21: qty 3

## 2022-12-21 MED ORDER — METHOCARBAMOL 500 MG PO TABS: 500.0000 mg | ORAL_TABLET | Freq: Three times a day (TID) | ORAL | 1 refills | Status: AC | PRN

## 2022-12-21 MED ORDER — DEXAMETHASONE SODIUM PHOSPHATE 10 MG/ML IJ SOLN
INTRAMUSCULAR | Status: AC
  Filled 2022-12-21: qty 1

## 2022-12-21 MED ORDER — POLYETHYLENE GLYCOL 3350 17 G PO PACK: 17.0000 g | PACK | Freq: Every day | ORAL | 0 refills | Status: AC

## 2022-12-21 MED ORDER — ONDANSETRON HCL 4 MG/2ML IJ SOLN: 4.0000 mg | Freq: Four times a day (QID) | INTRAMUSCULAR | Status: DC | PRN

## 2022-12-21 MED ORDER — PROPOFOL 10 MG/ML IV BOLUS
INTRAVENOUS | Status: DC | PRN
  Administered 2022-12-21 (×2): 10 mg via INTRAVENOUS

## 2022-12-21 MED ORDER — MAGNESIUM CITRATE PO SOLN: 1.0000 | Freq: Once | ORAL | Status: DC | PRN

## 2022-12-21 MED ORDER — ONDANSETRON HCL 4 MG/2ML IJ SOLN
INTRAMUSCULAR | Status: AC
  Filled 2022-12-21: qty 2

## 2022-12-21 MED ORDER — SODIUM CHLORIDE (PF) 0.9 % IJ SOLN
INTRAMUSCULAR | Status: AC
  Filled 2022-12-21: qty 50

## 2022-12-21 MED ORDER — MENTHOL 3 MG MT LOZG: 1.0000 | LOZENGE | OROMUCOSAL | Status: DC | PRN

## 2022-12-21 MED ORDER — BUPIVACAINE-EPINEPHRINE 0.25% -1:200000 IJ SOLN
INTRAMUSCULAR | Status: AC
  Filled 2022-12-21: qty 1

## 2022-12-21 MED ORDER — SODIUM CHLORIDE 0.9 % IR SOLN
Status: DC | PRN
  Administered 2022-12-21: 3000 mL
  Administered 2022-12-21: 1000 mL

## 2022-12-21 MED ORDER — FENTANYL CITRATE PF 50 MCG/ML IJ SOSY: 25.0000 ug | PREFILLED_SYRINGE | INTRAMUSCULAR | Status: DC | PRN

## 2022-12-21 MED ORDER — LYSINE 500 MG PO CAPS: 500.0000 mg | ORAL_CAPSULE | Freq: Every day | ORAL | Status: DC

## 2022-12-21 MED ORDER — METHOCARBAMOL 500 MG PO TABS
500.0000 mg | ORAL_TABLET | Freq: Four times a day (QID) | ORAL | Status: DC | PRN
  Administered 2022-12-22 (×2): 500 mg via ORAL
  Filled 2022-12-21 (×2): qty 1

## 2022-12-21 MED ORDER — BUPIVACAINE LIPOSOME 1.3 % IJ SUSP
INTRAMUSCULAR | Status: AC
  Filled 2022-12-21: qty 20

## 2022-12-21 MED ORDER — ONDANSETRON HCL 4 MG/2ML IJ SOLN: 4.0000 mg | Freq: Once | INTRAMUSCULAR | Status: DC | PRN

## 2022-12-21 MED ORDER — DOCUSATE SODIUM 100 MG PO CAPS: 100.0000 mg | ORAL_CAPSULE | Freq: Two times a day (BID) | ORAL | 2 refills | Status: AC

## 2022-12-21 MED ORDER — ALFUZOSIN HCL ER 10 MG PO TB24
10.0000 mg | ORAL_TABLET | Freq: Every day | ORAL | Status: DC
  Administered 2022-12-22: 10 mg via ORAL
  Filled 2022-12-21: qty 1

## 2022-12-21 MED ORDER — METOCLOPRAMIDE HCL 5 MG/ML IJ SOLN: 5.0000 mg | Freq: Three times a day (TID) | INTRAMUSCULAR | Status: DC | PRN

## 2022-12-21 MED ORDER — TRAZODONE HCL 50 MG PO TABS: 50.0000 mg | ORAL_TABLET | Freq: Every evening | ORAL | Status: DC | PRN

## 2022-12-21 MED ORDER — KCL IN DEXTROSE-NACL 20-5-0.45 MEQ/L-%-% IV SOLN
INTRAVENOUS | Status: DC
  Filled 2022-12-21 (×2): qty 1000

## 2022-12-21 MED ORDER — DIPHENHYDRAMINE HCL 12.5 MG/5ML PO ELIX: 12.5000 mg | ORAL_SOLUTION | ORAL | Status: DC | PRN

## 2022-12-21 MED ORDER — 0.9 % SODIUM CHLORIDE (POUR BTL) OPTIME
TOPICAL | Status: DC | PRN
  Administered 2022-12-21: 1000 mL

## 2022-12-21 MED ORDER — ASPIRIN 81 MG PO CHEW
81.0000 mg | CHEWABLE_TABLET | Freq: Two times a day (BID) | ORAL | Status: DC
  Administered 2022-12-22: 81 mg via ORAL
  Filled 2022-12-21: qty 1

## 2022-12-21 MED ORDER — AMISULPRIDE (ANTIEMETIC) 5 MG/2ML IV SOLN
INTRAVENOUS | Status: AC
  Filled 2022-12-21: qty 2

## 2022-12-21 MED ORDER — METHOCARBAMOL 500 MG IVPB - SIMPLE MED: 500.0000 mg | Freq: Four times a day (QID) | INTRAVENOUS | Status: DC | PRN

## 2022-12-21 MED ORDER — ROPIVACAINE HCL 5 MG/ML IJ SOLN
INTRAMUSCULAR | Status: DC | PRN
  Administered 2022-12-21: 30 mL via PERINEURAL

## 2022-12-21 MED ORDER — LACTATED RINGERS IV SOLN: INTRAVENOUS | Status: DC

## 2022-12-21 MED ORDER — PRONTOSAN WOUND IRRIGATION OPTIME
TOPICAL | Status: DC | PRN
Start: 2022-12-21 — End: 2022-12-21
  Administered 2022-12-21: 1 via TOPICAL

## 2022-12-21 MED ORDER — PANTOPRAZOLE SODIUM 40 MG PO TBEC
40.0000 mg | DELAYED_RELEASE_TABLET | Freq: Every day | ORAL | Status: DC
  Administered 2022-12-22: 40 mg via ORAL
  Filled 2022-12-21: qty 1

## 2022-12-21 MED ORDER — LISINOPRIL 20 MG PO TABS
40.0000 mg | ORAL_TABLET | Freq: Every day | ORAL | Status: DC
  Administered 2022-12-22: 40 mg via ORAL
  Filled 2022-12-21: qty 2

## 2022-12-21 MED ORDER — OXYCODONE HCL 5 MG PO TABS
5.0000 mg | ORAL_TABLET | ORAL | 0 refills | Status: AC | PRN
Start: 2022-12-21 — End: ?

## 2022-12-21 MED ORDER — ADULT MULTIVITAMIN W/MINERALS CH
1.0000 | ORAL_TABLET | Freq: Every day | ORAL | Status: DC
  Administered 2022-12-22: 1 via ORAL
  Filled 2022-12-21: qty 1

## 2022-12-21 MED ORDER — METOCLOPRAMIDE HCL 5 MG PO TABS: 5.0000 mg | ORAL_TABLET | Freq: Three times a day (TID) | ORAL | Status: DC | PRN

## 2022-12-21 MED ORDER — ZOLPIDEM TARTRATE 5 MG PO TABS: 5.0000 mg | ORAL_TABLET | Freq: Every evening | ORAL | Status: DC | PRN

## 2022-12-21 MED ORDER — STERILE WATER FOR IRRIGATION IR SOLN
Status: DC | PRN
  Administered 2022-12-21: 2000 mL

## 2022-12-21 MED ORDER — BUPIVACAINE-EPINEPHRINE 0.25% -1:200000 IJ SOLN
INTRAMUSCULAR | Status: DC | PRN
  Administered 2022-12-21: 12 mL

## 2022-12-21 MED ORDER — ONDANSETRON HCL 4 MG/2ML IJ SOLN
INTRAMUSCULAR | Status: DC | PRN
  Administered 2022-12-21: 4 mg via INTRAVENOUS

## 2022-12-21 MED ORDER — EPHEDRINE SULFATE-NACL 50-0.9 MG/10ML-% IV SOSY
PREFILLED_SYRINGE | INTRAVENOUS | Status: DC | PRN
Start: 2022-12-21 — End: 2022-12-21
  Administered 2022-12-21 (×2): 5 mg via INTRAVENOUS

## 2022-12-21 SURGICAL SUPPLY — 75 items
AGENT HMST SPONGE THK3/8 (HEMOSTASIS)
ATTUNE MED DOME PAT 38 KNEE (Knees) IMPLANT
ATTUNE PS FEM LT SZ 6 CEM KNEE (Femur) IMPLANT
ATTUNE PSRP INSR SZ6 8 KNEE (Insert) IMPLANT
BAG COUNTER SPONGE SURGICOUNT (BAG) IMPLANT
BAG DECANTER FOR FLEXI CONT (MISCELLANEOUS) ×1 IMPLANT
BAG SPEC THK2 15X12 ZIP CLS (MISCELLANEOUS)
BAG SPNG CNTER NS LX DISP (BAG)
BAG ZIPLOCK 12X15 (MISCELLANEOUS) IMPLANT
BASE TIBIA ATTUNE KNEE SYS SZ6 (Knees) IMPLANT
BLADE SAW SGTL 11.0X1.19X90.0M (BLADE) ×1 IMPLANT
BLADE SAW SGTL 13.0X1.19X90.0M (BLADE) ×1 IMPLANT
BLADE SURG SZ10 CARB STEEL (BLADE) ×2 IMPLANT
BNDG CMPR 5X4 KNIT ELC UNQ LF (GAUZE/BANDAGES/DRESSINGS) ×1
BNDG CMPR 6 X 5 YARDS HK CLSR (GAUZE/BANDAGES/DRESSINGS) ×1
BNDG ELASTIC 4INX 5YD STR LF (GAUZE/BANDAGES/DRESSINGS) ×1 IMPLANT
BNDG ELASTIC 6INX 5YD STR LF (GAUZE/BANDAGES/DRESSINGS) ×1 IMPLANT
BOWL SMART MIX CTS (DISPOSABLE) ×1 IMPLANT
BSPLAT TIB 6 CMNT ROT PLAT STR (Knees) ×1 IMPLANT
CEMENT HV SMART SET (Cement) ×2 IMPLANT
COVER SURGICAL LIGHT HANDLE (MISCELLANEOUS) ×1 IMPLANT
CUFF TOURN SGL QUICK 34 (TOURNIQUET CUFF) ×1
CUFF TRNQT CYL 34X4.125X (TOURNIQUET CUFF) ×1 IMPLANT
DRAPE INCISE IOBAN 66X45 STRL (DRAPES) IMPLANT
DRAPE ORTHO SPLIT 77X108 STRL (DRAPES) ×2
DRAPE SHEET LG 3/4 BI-LAMINATE (DRAPES) ×1 IMPLANT
DRAPE SURG ORHT 6 SPLT 77X108 (DRAPES) ×2 IMPLANT
DRAPE TOP 10253 STERILE (DRAPES) ×1 IMPLANT
DRAPE U-SHAPE 47X51 STRL (DRAPES) ×1 IMPLANT
DRSG AQUACEL AG ADV 3.5X10 (GAUZE/BANDAGES/DRESSINGS) ×1 IMPLANT
DRSG TEGADERM 4X4.75 (GAUZE/BANDAGES/DRESSINGS) IMPLANT
DURAPREP 26ML APPLICATOR (WOUND CARE) ×1 IMPLANT
ELECT BLADE TIP CTD 4 INCH (ELECTRODE) IMPLANT
ELECT REM PT RETURN 15FT ADLT (MISCELLANEOUS) ×1 IMPLANT
EVACUATOR 1/8 PVC DRAIN (DRAIN) IMPLANT
GAUZE SPONGE 2X2 8PLY STRL LF (GAUZE/BANDAGES/DRESSINGS) IMPLANT
GLOVE BIO SURGEON STRL SZ7 (GLOVE) ×1 IMPLANT
GLOVE BIOGEL PI IND STRL 7.0 (GLOVE) ×1 IMPLANT
GLOVE BIOGEL PI IND STRL 8 (GLOVE) ×1 IMPLANT
GLOVE SURG SS PI 8.0 STRL IVOR (GLOVE) ×1 IMPLANT
GOWN STRL REUS W/ TWL XL LVL3 (GOWN DISPOSABLE) ×2 IMPLANT
GOWN STRL REUS W/TWL XL LVL3 (GOWN DISPOSABLE) ×2
HANDPIECE INTERPULSE COAX TIP (DISPOSABLE) ×1
HEMOSTAT SPONGE AVITENE ULTRA (HEMOSTASIS) IMPLANT
HOLDER FOLEY CATH W/STRAP (MISCELLANEOUS) IMPLANT
IMMOBILIZER KNEE 20 (SOFTGOODS) ×1
IMMOBILIZER KNEE 20 THIGH 36 (SOFTGOODS) ×1 IMPLANT
KIT TURNOVER KIT A (KITS) IMPLANT
MANIFOLD NEPTUNE II (INSTRUMENTS) ×1 IMPLANT
NS IRRIG 1000ML POUR BTL (IV SOLUTION) IMPLANT
PACK TOTAL KNEE CUSTOM (KITS) ×1 IMPLANT
PIN STEINMAN FIXATION KNEE (PIN) IMPLANT
PROTECTOR NERVE ULNAR (MISCELLANEOUS) ×1 IMPLANT
SAW OSC TIP CART 19.5X105X1.3 (SAW) IMPLANT
SEALER BIPOLAR AQUA 6.0 (INSTRUMENTS) IMPLANT
SET HNDPC FAN SPRY TIP SCT (DISPOSABLE) ×1 IMPLANT
SOLUTION PRONTOSAN WOUND 350ML (IRRIGATION / IRRIGATOR) ×1 IMPLANT
SPIKE FLUID TRANSFER (MISCELLANEOUS) ×1 IMPLANT
STAPLER VISISTAT (STAPLE) IMPLANT
STRIP CLOSURE SKIN 1/2X4 (GAUZE/BANDAGES/DRESSINGS) IMPLANT
SUT BONE WAX W31G (SUTURE) ×1 IMPLANT
SUT MNCRL AB 4-0 PS2 18 (SUTURE) IMPLANT
SUT STRATAFIX 0 PDS 27 VIOLET (SUTURE) ×1
SUT VIC AB 1 CT1 27 (SUTURE) ×3
SUT VIC AB 1 CT1 27XBRD ANTBC (SUTURE) ×3 IMPLANT
SUT VIC AB 2-0 CT1 27 (SUTURE) ×3
SUT VIC AB 2-0 CT1 TAPERPNT 27 (SUTURE) ×3 IMPLANT
SUTURE STRATFX 0 PDS 27 VIOLET (SUTURE) ×1 IMPLANT
SYR 3ML LL SCALE MARK (SYRINGE) IMPLANT
TIBIA ATTUNE KNEE SYS BASE SZ6 (Knees) ×1 IMPLANT
TRAY FOLEY MTR SLVR 16FR STAT (SET/KITS/TRAYS/PACK) ×1 IMPLANT
TUBE SUCTION HIGH CAP CLEAR NV (SUCTIONS) ×1 IMPLANT
WATER STERILE IRR 1000ML POUR (IV SOLUTION) ×1 IMPLANT
WIPE CHG 2% PREP (PERSONAL CARE ITEMS) ×1 IMPLANT
WRAP KNEE MAXI GEL POST OP (GAUZE/BANDAGES/DRESSINGS) ×1 IMPLANT

## 2022-12-21 NOTE — Anesthesia Procedure Notes (Signed)
Procedure Name: MAC Date/Time: 12/21/2022 11:18 AM  Performed by: Orest Dikes, CRNAPre-anesthesia Checklist: Patient identified, Emergency Drugs available, Suction available and Patient being monitored Oxygen Delivery Method: Simple face mask

## 2022-12-21 NOTE — Anesthesia Procedure Notes (Signed)
Spinal  Patient location during procedure: OR Start time: 12/21/2022 11:22 AM End time: 12/21/2022 11:32 AM Reason for block: surgical anesthesia Staffing Performed: anesthesiologist  Anesthesiologist: Leonides Grills, MD Performed by: Leonides Grills, MD Authorized by: Leonides Grills, MD   Preanesthetic Checklist Completed: patient identified, IV checked, risks and benefits discussed, surgical consent, monitors and equipment checked, pre-op evaluation and timeout performed Spinal Block Patient position: sitting Prep: DuraPrep Patient monitoring: cardiac monitor, continuous pulse ox and blood pressure Approach: midline Location: L3-4 Injection technique: single-shot Needle Needle type: Quincke  Needle gauge: 22 G Needle length: 9 cm Assessment Sensory level: T10 Events: CSF return Additional Notes Functioning IV was confirmed and monitors were applied. Sterile prep and drape, including hand hygiene and sterile gloves were used. The patient was positioned and the spine was prepped. The skin was anesthetized with lidocaine.  Free flow of clear CSF was obtained on the third attempt prior to injecting local anesthetic into the CSF.  The spinal needle aspirated freely following injection.  The needle was carefully withdrawn.  The patient tolerated the procedure well.

## 2022-12-21 NOTE — Discharge Instructions (Signed)

## 2022-12-21 NOTE — Transfer of Care (Signed)
Immediate Anesthesia Transfer of Care Note  Patient: ASHTEN PRATS  Procedure(s) Performed: TOTAL KNEE ARTHROPLASTY (Left: Knee)  Patient Location: PACU  Anesthesia Type:MAC and Spinal  Level of Consciousness: awake, alert , and oriented  Airway & Oxygen Therapy: Patient Spontanous Breathing and Patient connected to face mask oxygen  Post-op Assessment: Report given to RN and Post -op Vital signs reviewed and stable  Post vital signs: Reviewed and stable  Last Vitals:  Vitals Value Taken Time  BP 117/71 12/21/22 1349  Temp    Pulse 59 12/21/22 1351  Resp 17 12/21/22 1351  SpO2 100 % 12/21/22 1351  Vitals shown include unfiled device data.  Last Pain:  Vitals:   12/21/22 1100  TempSrc:   PainSc: 0-No pain         Complications: No notable events documented.

## 2022-12-21 NOTE — Interval H&P Note (Signed)
History and Physical Interval Note:  12/21/2022 10:37 AM  Curtis Davis  has presented today for surgery, with the diagnosis of Left knee degenerative joint disease.  The various methods of treatment have been discussed with the patient and family. After consideration of risks, benefits and other options for treatment, the patient has consented to  Procedure(s): TOTAL KNEE ARTHROPLASTY (Left) as a surgical intervention.  The patient's history has been reviewed, patient examined, no change in status, stable for surgery.  I have reviewed the patient's chart and labs.  Questions were answered to the patient's satisfaction.     Javier Docker

## 2022-12-21 NOTE — Plan of Care (Signed)

## 2022-12-21 NOTE — Anesthesia Procedure Notes (Signed)
Anesthesia Regional Block: Adductor canal block   Pre-Anesthetic Checklist: , timeout performed,  Correct Patient, Correct Site, Correct Laterality,  Correct Procedure,, site marked,  Risks and benefits discussed,  Surgical consent,  Pre-op evaluation,  At surgeon's request and post-op pain management  Laterality: Left  Prep: chloraprep       Needles:  Injection technique: Single-shot  Needle Type: Echogenic Stimulator Needle     Needle Length: 9cm  Needle Gauge: 21     Additional Needles:   Procedures:,,,, ultrasound used (permanent image in chart),,    Narrative:  Start time: 12/21/2022 10:45 AM End time: 12/21/2022 10:55 AM Injection made incrementally with aspirations every 5 mL.  Performed by: Personally  Anesthesiologist: Leonides Grills, MD  Additional Notes: Functioning IV was confirmed and monitors were applied. A time-out was performed. Hand hygiene and sterile gloves were used. The thigh was placed in a frog-leg position and prepped in a sterile fashion. A 90mm 21ga Arrow echogenic stimulator needle was placed using ultrasound guidance.  Negative aspiration and negative test dose prior to incremental administration of local anesthetic. The patient tolerated the procedure well.

## 2022-12-21 NOTE — Op Note (Unsigned)
NAME: Curtis Davis, Curtis Davis. MEDICAL RECORD NO: [id number] ACCOUNT NO: [id number] DATE OF BIRTH: [birth year] FACILITY: [facility] LOCATION: [facility] PHYSICIAN: Javier Docker, MD  Operative Report   DATE OF PROCEDURE: 12/21/2022  PREOPERATIVE DIAGNOSIS:  End-stage osteoarthrosis, varus deformity, left knee.  POSTOPERATIVE DIAGNOSIS:  End-stage osteoarthrosis, varus deformity, left knee.  PROCEDURE PERFORMED:  Left total knee arthroplasty utilizing Attune DePuy rotating platform 6 femur, 6 tibia, 8 mm insert, 38 patella.  ANESTHESIA:  Spinal.  ASSISTANT:  Andrez Grime, PA.  HISTORY:  A 77 year old end-stage osteoarthrosis with significant varus deformity of the left knee in a flexion contracture.  The patient was indicated for replacement of the degenerated joint.  Risks and benefits discussed including bleeding, infection,  damage to neurovascular structures, no change in symptoms, worsening symptoms, DVT, PE, anesthetic complications, etc.  DESCRIPTION OF PROCEDURE:  The patient in supine position, after induction of adequate spinal anesthesia and 2 grams Kefzol left lower extremity was prepped and draped and exsanguinated in usual sterile fashion.  Thigh tourniquet inflated to 225 mmHg.   Midline incision was then made over the patella.  Full thickness flaps developed.  Median parapatellar arthrotomy was then performed.  Soft tissue elevated medially, preserving the MCL.  Gently everted the patella.  Knee was flexed.  Tricompartmental  osteoarthrosis was noted, particularly medial compartment, patellofemoral joint.  Leksell was utilized to remove osteophytes.  Remnants of the medial and lateral menisci.  To fashion, a notch above the femoral notch as a starting hole for the femoral  drill to enter the canal in line with the femur.  This was then irrigated, utilized a, T-handle and then intramedullary guide, 5-degree left with 10 off the distal femur, pinned I performed a distal femoral cut.  I  then subluxed the tibia the wear was  significant on the medial side and mid portion medially.  Using external alignment guide, 2 off the defect, which was medially bisecting the tibiotalar joint parallel to the shaft, 3 degrees slope.  This was pinned, measured on the lateral side, which  was a 9.  I performed our tibial cut, protecting the soft tissues posteriorly at all times.  I then measured extension gap and it was satisfactory at 8 mm.  The capsule was intact as was the popliteus.  The knee was reflexed, tibia subluxed measured to a  6 maximizing coverage just the medial aspect of tibial tubercle.  This was then pinned.  I harvested bone centrally and impacted into the distal femur.  I then drilled centrally and then used our punch guide.  We removed osteophytes medially.  Turned  attention back to the femur, I used a box cutting guide, bisecting the canal.  This was then pinned.  I performed a box cut.  I then inserted the trial femur #6, which fit flush.  I then inserted an 8 mm insert, reduced the knee and had full extension,  full flexion, good stability with varus and valgus stressing at 0 and 30 degrees and negative anterior drawer.  Patella was then everted, measured to a 24 planed to a 16 utilizing the patellar jig.  Measured the patella at 38.  Trial paddle parallel to  the joint surface.  Slightly medializing the peg holes.  I placed a 38 mm trial patella, reduced it and had excellent patellofemoral tracking.  All instrumentation was then removed and we checked posteriorly and remnants of menisci were removed.   Aquamantys was utilized.  Cauterized geniculates.  I used pulsatile lavage thoroughly  clean all bony surfaces.  Knee was flexed.  All surfaces thoroughly dried.  I placed drill holes through the eburnated bone and a small cyst over the medial proximal  tibia.  Mixed cement on the back table under vacuum.  I then placed it in the proximal tibia, digitally pressurizing the cement.   Cement was placed on the implant and then impacted into place.  Redundant cement removed.  Cement was placed on the femur  and the femoral component, impacted into place fit flush.  Redundant cement removed.  I placed an 8 mm trial insert, reduced it and held axial load throughout the curing of the cement in extension.  Cemented and clamped the patella.  Marcaine with  epinephrine and Prontosan was placed in the wound during the curing of the cemented and the wound was covered.  Following the curing of the cement, the tourniquet was deflated at 62 minutes.  Any bleeding was cauterized with the Aquamantys.  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  This was cauterized as well.  I removed the trial insert and meticulously removed all redundant cement removed.  I used pulsatile lavage followed by Prontosan before inserting the permanent insert  8 mm.  This was reduced.  I then in mid flexion reapproximated the patellar arthrotomy with 1-0 Vicryl in interrupted figure-of-eight sutures were oversewn with a running Stratafix.  Again, full flexion and full extension, good stability to varus valgus  stressing at 0-30 degrees, negative anterior drawer and excellent patellofemoral tracking.  Irrigated the subcutaneous tissue with pulsatile lavage and Prontosan.  I closed the subcutaneous tissue with 2-0 followed by subcuticular Monocryl.  Aquacel was  then placed.  He was placed in immobilizer and transported to the recovery room in satisfactory condition.  The patient tolerated the procedure well.  No complications.  ASSISTANT:  Andrez Grime, PA, was used throughout the case for patient positioning, general intermittent retraction, exposure and closure.  BLOOD LOSS:  50 mL.   PUS D: 12/21/2022 1:27:47 pm T: 12/21/2022 4:58:00 pm  JOB: [id number]/ [id number]

## 2022-12-21 NOTE — Brief Op Note (Signed)
12/21/2022  1:20 PM  PATIENT:  Curtis Davis  77 y.o. male  PRE-OPERATIVE DIAGNOSIS:  Left knee degenerative joint disease  POST-OPERATIVE DIAGNOSIS:  Left knee degenerative joint disease  PROCEDURE:  Procedure(s): TOTAL KNEE ARTHROPLASTY (Left) The aquamantis was utilized for this case to help facilitate better hemostasis as patient was felt to be at increased risk of bleeding because of complex case requiring increased OR time and/or exposure.   SURGEON:  Surgeons and Role:    * Jene Every, MD - Primary  PHYSICIAN ASSISTANT:   ASSISTANTS: Bissell   ANESTHESIA:   spinal  EBL:  50   BLOOD ADMINISTERED:none  DRAINS: none   LOCAL MEDICATIONS USED:  MARCAINE     SPECIMEN:  No Specimen  DISPOSITION OF SPECIMEN:  N/A  COUNTS:  YES  TOURNIQUET:   Total Tourniquet Time Documented: Thigh (Left) - 62 minutes Total: Thigh (Left) - 62 minutes   DICTATION: .Other Dictation: Dictation Number   [id number]  PLAN OF CARE: Admit for overnight observation  PATIENT DISPOSITION:  PACU - hemodynamically stable.   Delay start of Pharmacological VTE agent (>24hrs) due to surgical blood loss or risk of bleeding: no

## 2022-12-21 NOTE — Evaluation (Signed)
Physical Therapy Evaluation Patient Details Name: Curtis Davis MRN: [id number] DOB: 1945/07/21 Today's Date: 12/21/2022  History of Present Illness  77 yo male presents to therapy s/p L TKA on 12/21/2022 due to failure of conservative measures. 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  Clinical Impression      ROLONDO Davis is a 77 y.o. male POD 0 s/p L TKA. Patient reports mod I with mobility at baseline. Patient is now limited by functional impairments (see PT problem list below) and requires CGA for bed mobility and CGA and cues for transfers. Patient was able to ambulate 50 feet with RW and CGA level of assist. Patient instructed in exercise to facilitate ROM and circulation to manage edema. Patient will benefit from continued skilled PT interventions to address impairments and progress towards PLOF. Acute PT will follow to progress mobility and stair training in preparation for safe discharge home with family support and OPPT services.     If plan is discharge home, recommend the following: A little help with walking and/or transfers;A little help with bathing/dressing/bathroom;Assistance with cooking/housework;Assist for transportation;Help with stairs or ramp for entrance   Can travel by private vehicle        Equipment Recommendations None recommended by PT  Recommendations for Other Services       Functional Status Assessment Patient has had a recent decline in their functional status and demonstrates the ability to make significant improvements in function in a reasonable and predictable amount of time.     Precautions / Restrictions Precautions Precautions: Knee;Fall Restrictions Weight Bearing Restrictions: No      Mobility  Bed Mobility Overal bed mobility: Needs Assistance Bed Mobility: Supine to Sit     Supine to sit: Contact guard, HOB elevated     General bed mobility comments: min cues     Transfers Overall transfer level: Needs assistance Equipment used: Rolling walker (2 wheels) Transfers: Sit to/from Stand Sit to Stand: Contact guard assist, From elevated surface           General transfer comment: min cues    Ambulation/Gait Ambulation/Gait assistance: Contact guard assist Gait Distance (Feet): 50 Feet Assistive device: Rolling walker (2 wheels) Gait Pattern/deviations: Step-to pattern, Antalgic, Trunk flexed Gait velocity: decreased     General Gait Details: limited L knee flexion with swing phase and use of B UE support at RW to offload L LE in stances phase, min cues  Stairs            Wheelchair Mobility     Tilt Bed    Modified Rankin (Stroke Patients Only)       Balance Overall balance assessment: Needs assistance Sitting-balance support: Feet supported Sitting balance-Leahy Scale: Good     Standing balance support: Bilateral upper extremity supported, During functional activity, Reliant on assistive device for balance Standing balance-Leahy Scale: Poor                               Pertinent Vitals/Pain Pain Assessment Pain Assessment: 0-10 Pain Score: 8  Pain Location: L knee Pain Descriptors / Indicators: Aching, Constant, Discomfort, Grimacing, Operative site guarding Pain Intervention(s): Limited activity within patient's tolerance, Monitored during session, Premedicated before session, Repositioned, Ice applied    Home Living Family/patient expects to be discharged to:: Private residence Living Arrangements: Spouse/significant other Available Help at Discharge: Family Type of Home: House Home  Access: Ramped entrance       Home Layout: One level Home Equipment: BSC/3in1;Rolling Walker (2 wheels);Cane - single point      Prior Function Prior Level of Function : Independent/Modified Independent;Driving;Working/employed             Mobility Comments: SPC for mobility,  mod I for ADLs, self care  tasks       Extremity/Trunk Assessment        Lower Extremity Assessment Lower Extremity Assessment: LLE deficits/detail LLE Deficits / Details: ankle DF/PF 5/5; SLR < 10 degree lag LLE Sensation: WNL    Cervical / Trunk Assessment Cervical / Trunk Assessment:  (slight head forward)  Communication   Communication Communication: No apparent difficulties  Cognition Arousal: Alert Behavior During Therapy: WFL for tasks assessed/performed Overall Cognitive Status: Within Functional Limits for tasks assessed                                          General Comments      Exercises Total Joint Exercises Ankle Circles/Pumps: AROM, Both, 15 reps   Assessment/Plan    PT Assessment Patient needs continued PT services  PT Problem List Decreased strength;Decreased range of motion;Decreased balance;Decreased activity tolerance;Decreased mobility;Decreased coordination;Pain       PT Treatment Interventions DME instruction;Gait training;Functional mobility training;Therapeutic activities;Therapeutic exercise;Balance training;Neuromuscular re-education;Patient/family education;Modalities    PT Goals (Current goals can be found in the Care Plan section)  Acute Rehab PT Goals Patient Stated Goal: to be able to keep up the family buisness PT Goal Formulation: With patient Time For Goal Achievement: 01/04/23 Potential to Achieve Goals: Good    Frequency 7X/week     Co-evaluation               AM-PAC PT "6 Clicks" Mobility  Outcome Measure Help needed turning from your back to your side while in a flat bed without using bedrails?: A Little Help needed moving from lying on your back to sitting on the side of a flat bed without using bedrails?: A Little Help needed moving to and from a bed to a chair (including a wheelchair)?: A Little Help needed standing up from a chair using your arms (e.g., wheelchair or bedside chair)?: A Little Help needed to walk in  hospital room?: A Little Help needed climbing 3-5 steps with a railing? : A Lot 6 Click Score: 17    End of Session Equipment Utilized During Treatment: Gait belt Activity Tolerance: Patient tolerated treatment well;No increased pain Patient left: in chair;with call bell/phone within reach;with family/visitor present Nurse Communication: Mobility status PT Visit Diagnosis: Unsteadiness on feet (R26.81);Other abnormalities of gait and mobility (R26.89);Muscle weakness (generalized) (M62.81);Pain;Difficulty in walking, not elsewhere classified (R26.2) Pain - Right/Left: Left Pain - part of body: Knee;Leg    Time: 3244-0102 PT Time Calculation (min) (ACUTE ONLY): 20 min   Charges:   PT Evaluation $PT Eval Low Complexity: 1 Low   PT General Charges $$ ACUTE PT VISIT: 1 Visit         Johnny Bridge, PT Acute Rehab   Jacqualyn Posey 12/21/2022, 7:17 PM

## 2022-12-21 NOTE — Care Plan (Signed)
Ortho Bundle Case Management Note  Patient Details  Name: Curtis Davis MRN: [id number] Date of Birth: [birth year]                  L TKA on 12/21/22.  DCP: Home with wife Harriett Sine.  DME: No needs. Has RW and cane.  PT: EO 10/7 5:15pm   DME Arranged:  N/A DME Agency:       Additional Comments: Please contact me with any questions of if this plan should need to change.    Despina Pole, CCM Case Manager, [facility]  [phone] 12/21/2022, 1:19 PM

## 2022-12-21 NOTE — Plan of Care (Signed)
  Problem: Education: Goal: Knowledge of the prescribed therapeutic regimen will improve Outcome: Progressing   Problem: Activity: Goal: Range of joint motion will improve Outcome: Progressing   Problem: Pain Management: Goal: Pain level will decrease with appropriate interventions Outcome: Progressing   Problem: Safety: Goal: Ability to remain free from injury will improve Outcome: Progressing   

## 2022-12-21 NOTE — Anesthesia Preprocedure Evaluation (Addendum)
Anesthesia Evaluation  Patient identified by MRN, date of birth, ID band Patient awake    Reviewed: Allergy & Precautions, NPO status , Patient's Chart, lab work & pertinent test results  History of Anesthesia Complications (+) PONV and history of anesthetic complications  Airway Mallampati: II  TM Distance: >3 FB Neck ROM: Full    Dental no notable dental hx.    Pulmonary neg pulmonary ROS   Pulmonary exam normal        Cardiovascular hypertension, Pt. on medications Normal cardiovascular exam     Neuro/Psych negative neurological ROS  negative psych ROS   GI/Hepatic Neg liver ROS,GERD  Medicated and Controlled,,  Endo/Other  negative endocrine ROS    Renal/GU negative Renal ROS     Musculoskeletal  (+) Arthritis ,    Abdominal  (+) + obese  Peds  Hematology  (+) Blood dyscrasia, anemia   Anesthesia Other Findings Left knee degenerative joint disease  Reproductive/Obstetrics                              Anesthesia Physical Anesthesia Plan  ASA: 2  Anesthesia Plan: Spinal and Regional   Post-op Pain Management:    Induction: Intravenous  PONV Risk Score and Plan: 2 and Ondansetron, Dexamethasone, Propofol infusion, Midazolam, Treatment may vary due to age or medical condition and Amisulpride  Airway Management Planned: Simple Face Mask  Additional Equipment:   Intra-op Plan:   Post-operative Plan:   Informed Consent: I have reviewed the patients History and Physical, chart, labs and discussed the procedure including the risks, benefits and alternatives for the proposed anesthesia with the patient or authorized representative who has indicated his/her understanding and acceptance.     Dental advisory given  Plan Discussed with: CRNA  Anesthesia Plan Comments:          Anesthesia Quick Evaluation

## 2022-12-22 ENCOUNTER — Encounter (HOSPITAL_COMMUNITY): Payer: Self-pay | Admitting: Specialist

## 2022-12-22 DIAGNOSIS — I10 Essential (primary) hypertension: Secondary | ICD-10-CM | POA: Diagnosis not present

## 2022-12-22 DIAGNOSIS — Z79899 Other long term (current) drug therapy: Secondary | ICD-10-CM | POA: Diagnosis not present

## 2022-12-22 DIAGNOSIS — K219 Gastro-esophageal reflux disease without esophagitis: Secondary | ICD-10-CM | POA: Diagnosis not present

## 2022-12-22 DIAGNOSIS — M21162 Varus deformity, not elsewhere classified, left knee: Secondary | ICD-10-CM | POA: Diagnosis not present

## 2022-12-22 DIAGNOSIS — M1712 Unilateral primary osteoarthritis, left knee: Secondary | ICD-10-CM | POA: Diagnosis not present

## 2022-12-22 LAB — BASIC METABOLIC PANEL
Anion gap: 9 (ref 5–15)
BUN: 14 mg/dL (ref 8–23)
CO2: 25 mmol/L (ref 22–32)
Calcium: 8.4 mg/dL — ABNORMAL LOW (ref 8.9–10.3)
Chloride: 102 mmol/L (ref 98–111)
Creatinine, Ser: 0.89 mg/dL (ref 0.61–1.24)
GFR, Estimated: >60 mL/min (ref >60)
Glucose, Bld: 164 mg/dL — ABNORMAL HIGH (ref 70–99)
Potassium: 3.7 mmol/L (ref 3.5–5.1)
Sodium: 136 mmol/L (ref 135–145)

## 2022-12-22 LAB — CBC
HCT: 40.9 % (ref 39.0–52.0)
Hemoglobin: 13.1 g/dL (ref 13.0–17.0)
MCH: 30.8 pg (ref 26.0–34.0)
MCHC: 32 g/dL (ref 30.0–36.0)
MCV: 96 fL (ref 80.0–100.0)
Platelets: 265 10*3/uL (ref 150–400)
RBC: 4.26 MIL/uL (ref 4.22–5.81)
RDW: 13.2 % (ref 11.5–15.5)
WBC: 12.1 10*3/uL — ABNORMAL HIGH (ref 4.0–10.5)
nRBC: 0 % (ref 0.0–0.2)

## 2022-12-22 MED ORDER — TRAZODONE HCL 50 MG PO TABS: 50.0000 mg | ORAL_TABLET | Freq: Every evening | ORAL | Status: DC | PRN

## 2022-12-22 MED ORDER — ZOLPIDEM TARTRATE 5 MG PO TABS: 5.0000 mg | ORAL_TABLET | Freq: Every evening | ORAL | Status: DC | PRN

## 2022-12-22 MED ORDER — GABAPENTIN 300 MG PO CAPS
300.0000 mg | ORAL_CAPSULE | Freq: Three times a day (TID) | ORAL | Status: DC
  Administered 2022-12-22: 300 mg via ORAL
  Filled 2022-12-22: qty 1

## 2022-12-22 MED ORDER — KETOROLAC TROMETHAMINE 15 MG/ML IJ SOLN
15.0000 mg | Freq: Four times a day (QID) | INTRAMUSCULAR | Status: DC
  Administered 2022-12-22: 15 mg via INTRAVENOUS
  Filled 2022-12-22: qty 1

## 2022-12-22 MED ORDER — GABAPENTIN 300 MG PO CAPS: 300.0000 mg | ORAL_CAPSULE | Freq: Three times a day (TID) | ORAL | 0 refills | Status: AC

## 2022-12-22 MED ORDER — KETOROLAC TROMETHAMINE 10 MG PO TABS: 10.0000 mg | ORAL_TABLET | Freq: Four times a day (QID) | ORAL | 0 refills | Status: AC | PRN

## 2022-12-22 NOTE — TOC Transition Note (Signed)
Transition of Care [facility]) - CM/SW Discharge Note  Patient Details  Name: Curtis Davis MRN: [id number] Date of Birth: 12-18-1945  [facility] CM/SW Contact:  Ewing Schlein, LCSW Phone Number: 12/22/2022, 11:06 AM  Clinical Narrative: Patient is expected to discharge home after working with PT. CSW met with patient and spouse, [name], to confirm discharge plan. Patient will go home with OPPT at [facility]. Patient has a rolling walker and cane at home, so there are no DME needs at this time. TOC signing off.   Final next level of care: OP Rehab Barriers to Discharge: No Barriers Identified  Patient Goals and CMS Choice Choice offered to / list presented to : NA  Discharge Plan and Services Additional resources added to the After Visit Summary for         DME Arranged: N/A DME Agency: NA  Social Determinants of Health (SDOH) Interventions SDOH Screenings   Food Insecurity: No Food Insecurity (12/21/2022)  Housing: Low Risk  (12/21/2022)  Transportation Needs: No Transportation Needs (12/21/2022)  Utilities: Not At Risk (12/21/2022)  Tobacco Use: Low Risk  (12/21/2022)   Readmission Risk Interventions     No data to display

## 2022-12-22 NOTE — Progress Notes (Signed)
Physical Therapy Treatment Patient Details Name: Curtis Davis MRN: [id number] DOB: 10/15/1945 Today's Date: 12/22/2022   History of Present Illness 77 yo male presents to therapy s/p L TKA on 12/21/2022 due to failure of conservative measures. 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    PT Comments  POD # 1 am session Spouse present during session.  She has had a TKR so she is familiar with "the process".   Pt OOB in recliner doing "better" after pain med change.  Tolerated amb a functional distance in hallway.  Then returned to room to perform some TE's following HEP handout.  Instructed on proper tech, freq as well as use of ICE.   Addressed all mobility questions, discussed appropriate activity, educated on use of ICE.  Pt ready for D/C to home.    If plan is discharge home, recommend the following: A little help with walking and/or transfers;A little help with bathing/dressing/bathroom;Assistance with cooking/housework;Assist for transportation;Help with stairs or ramp for entrance   Can travel by private vehicle        Equipment Recommendations  None recommended by PT    Recommendations for Other Services       Precautions / Restrictions Precautions Precautions: Knee;Fall Precaution Comments: no pillow under knee Restrictions Weight Bearing Restrictions: Yes LLE Weight Bearing: Weight bearing as tolerated     Mobility  Bed Mobility               General bed mobility comments: OOB in recliner    Transfers Overall transfer level: Needs assistance Equipment used: Rolling walker (2 wheels) Transfers: Sit to/from Stand Sit to Stand: Supervision, Contact guard assist           General transfer comment: one VC on proper hand placement    Ambulation/Gait Ambulation/Gait assistance: Contact guard assist, Supervision Gait Distance (Feet): 85 Feet Assistive device: Rolling walker (2 wheels) Gait  Pattern/deviations: Step-to pattern, Antalgic, Trunk flexed Gait velocity: decreased     General Gait Details: pain controlled with med change.  Tolerated a functional distance in hallway.   Stairs Stairs:  (no stairs to enter home)           Wheelchair Mobility     Tilt Bed    Modified Rankin (Stroke Patients Only)       Balance                                            Cognition Arousal: Alert Behavior During Therapy: WFL for tasks assessed/performed Overall Cognitive Status: Within Functional Limits for tasks assessed                                 General Comments: AxO x 3 pleasant and pain much more controlled with new med change.        Exercises  Total Knee Replacement TE's following HEP handout 10 reps B LE ankle pumps 05 reps towel squeezes 05 reps knee presses 05 reps heel slides  05 reps SAQ's 05 reps SLR's 05 reps ABD Educated on use of gait belt to assist with TE's Followed by ICE     General Comments        Pertinent Vitals/Pain Pain Assessment Pain Assessment: 0-10 Pain Score: 3  Pain Location:  L knee Pain Descriptors / Indicators: Aching, Constant, Discomfort, Grimacing, Operative site guarding Pain Intervention(s): Monitored during session, Premedicated before session, Repositioned, Ice applied    Home Living                          Prior Function            PT Goals (current goals can now be found in the care plan section) Progress towards PT goals: Progressing toward goals    Frequency    7X/week      PT Plan      Co-evaluation              AM-PAC PT "6 Clicks" Mobility   Outcome Measure  Help needed turning from your back to your side while in a flat bed without using bedrails?: A Little Help needed moving from lying on your back to sitting on the side of a flat bed without using bedrails?: A Little Help needed moving to and from a bed to a chair (including  a wheelchair)?: A Little Help needed standing up from a chair using your arms (e.g., wheelchair or bedside chair)?: A Little Help needed to walk in hospital room?: A Little Help needed climbing 3-5 steps with a railing? : A Little 6 Click Score: 18    End of Session Equipment Utilized During Treatment: Gait belt Activity Tolerance: Patient tolerated treatment well;No increased pain Patient left: in chair;with call bell/phone within reach;with family/visitor present Nurse Communication: Mobility status PT Visit Diagnosis: Unsteadiness on feet (R26.81);Other abnormalities of gait and mobility (R26.89);Muscle weakness (generalized) (M62.81);Pain;Difficulty in walking, not elsewhere classified (R26.2) Pain - Right/Left: Left Pain - part of body: Knee;Leg     Time: 1610-9604 PT Time Calculation (min) (ACUTE ONLY): 40 min  Charges:    $Gait Training: 8-22 mins $Therapeutic Exercise: 8-22 mins $Therapeutic Activity: 8-22 mins PT General Charges $$ ACUTE PT VISIT: 1 Visit                     Felecia Shelling  PTA Acute  Rehabilitation Services Office M-F          [phone]

## 2022-12-22 NOTE — Progress Notes (Signed)
Subjective: 1 Day Post-Op Procedure(s) (LRB): TOTAL KNEE ARTHROPLASTY (Left) Patient reports pain as 4 on 0-10 scale.   Denies CP or SOB.  Voiding without difficulty. Positive flatus. Pain much better with addition of toradol and gabapentin, ready to go home. No N/V.  Objective: Vital signs in last 24 hours: Temp:  [97.6 F (36.4 C)-98.3 F (36.8 C)] 98.3 F (36.8 C) (10/03 0523) Pulse Rate:  [57-98] 89 (10/03 0523) Resp:  [10-19] 17 (10/03 0523) BP: (117-136)/(68-85) 123/76 (10/03 0523) SpO2:  [94 %-100 %] 97 % (10/03 0523) Weight:  [93.4 kg] 93.4 kg (10/02 0925)  Intake/Output from previous day: 10/02 0701 - 10/03 0700 In: 3209.9 [P.O.:360; I.V.:2350; IV Piggyback:499.9] Out: 2475 [Urine:2450; Blood:25] Intake/Output this shift: No intake/output data recorded.  Recent Labs    12/22/22 0331  HGB 13.1   Recent Labs    12/22/22 0331  WBC 12.1*  RBC 4.26  HCT 40.9  PLT 265   Recent Labs    12/22/22 0331  NA 136  K 3.7  CL 102  CO2 25  BUN 14  CREATININE 0.89  GLUCOSE 164*  CALCIUM 8.4*   No results for input(s): "LABPT", "INR" in the last 72 hours.  Neurologically intact ABD soft Neurovascular intact Sensation intact distally Dorsiflexion/Plantar flexion intact Incision: dressing C/D/I Compartment soft No DVT  Assessment/Plan:  1 Day Post-Op Procedure(s) (LRB): TOTAL KNEE ARTHROPLASTY (Left) Advance diet Up with therapy Discharge home with home health   Principal Problem:   Left knee DJD   D/C to home today Outpt PT scheduled   Curtis Davis 12/22/2022, @NOW 

## 2022-12-22 NOTE — Anesthesia Postprocedure Evaluation (Signed)
Anesthesia Post Note  Patient: RAHMIR BEEVER  Procedure(s) Performed: TOTAL KNEE ARTHROPLASTY (Left: Knee)     Patient location during evaluation: PACU Anesthesia Type: Regional and Spinal Level of consciousness: awake Pain management: pain level controlled Vital Signs Assessment: post-procedure vital signs reviewed and stable Respiratory status: spontaneous breathing, nonlabored ventilation and respiratory function stable Cardiovascular status: blood pressure returned to baseline and stable Postop Assessment: no apparent nausea or vomiting Anesthetic complications: no   No notable events documented.  Last Vitals:  Vitals:   12/22/22 0134 12/22/22 0523  BP: 133/85 123/76  Pulse: 88 89  Resp: 17 17  Temp: 36.6 C 36.8 C  SpO2: 97% 97%    Last Pain:  Vitals:   12/22/22 0523  TempSrc: Oral  PainSc:                  Catheryn Bacon Bach Rocchi

## 2022-12-22 NOTE — Discharge Summary (Signed)
Physician Discharge Summary   Patient ID: Curtis Davis MRN: [id number] DOB/AGE: Apr 08, 1945 77 y.o.  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   Weight as of this encounter: 93.4 kg.  Procedure:  Procedure(s) (LRB): TOTAL KNEE ARTHROPLASTY (Left)   Consults: None  HPI: see H&P Laboratory Data: Admission on 12/21/2022  Component Date Value Ref Range Status   WBC 12/22/2022 12.1 (H)  4.0 - 10.5 K/uL Final   RBC 12/22/2022 4.26  4.22 - 5.81 MIL/uL Final   Hemoglobin 12/22/2022 13.1  13.0 - 17.0 g/dL Final   HCT 40/98/1191 40.9  39.0 - 52.0 % Final   MCV 12/22/2022 96.0  80.0 - 100.0 fL Final   MCH 12/22/2022 30.8  26.0 - 34.0 pg Final   MCHC 12/22/2022 32.0  30.0 - 36.0 g/dL Final   RDW 47/82/9562 13.2  11.5 - 15.5 % Final   Platelets 12/22/2022 265  150 - 400 K/uL Final   nRBC 12/22/2022 0.0  0.0 - 0.2 % Final   Performed at [facility], [address] [place], [place] [address]   Sodium 12/22/2022 136  135 - 145 mmol/L Final   Potassium 12/22/2022 3.7  3.5 - 5.1 mmol/L Final   Chloride 12/22/2022 102  98 - 111 mmol/L Final   CO2 12/22/2022 25  22 - 32 mmol/L Final   Glucose, Bld 12/22/2022 164 (H)  70 - 99 mg/dL Final   Glucose reference range applies only to samples taken after fasting for at least 8 hours.   BUN 12/22/2022 14  8 - 23 mg/dL Final   Creatinine, Ser 12/22/2022 0.89  0.61 - 1.24 mg/dL Final   Calcium 57/84/6962 8.4 (L)  8.9 - 10.3 mg/dL Final   GFR, Estimated 12/22/2022  >60  >60 mL/min Final   Comment: (NOTE) Calculated using the CKD-EPI Creatinine Equation (2021)    Anion gap 12/22/2022 9  5 - 15 Final   Performed at [facility], [address], [place], [place] [address]  Hospital Outpatient Visit on 12/12/2022  Component Date Value Ref Range Status   MRSA, PCR 12/12/2022 NEGATIVE  NEGATIVE Final   Staphylococcus aureus 12/12/2022 NEGATIVE  NEGATIVE Final   Comment: (NOTE) The Xpert SA Assay (FDA approved for NASAL specimens in patients 70 years of age and older), is one component of a comprehensive surveillance program. It is not intended to diagnose infection nor to guide or monitor treatment. Performed at [facility], [address] [place], [place] [address]    Sodium 12/12/2022 136  135 - 145 mmol/L Final   Potassium 12/12/2022 3.9  3.5 - 5.1 mmol/L Final   Chloride 12/12/2022 104  98 - 111 mmol/L Final   CO2 12/12/2022 25  22 - 32 mmol/L Final   Glucose, Bld 12/12/2022 101 (H)  70 - 99 mg/dL Final   Glucose reference range applies only to samples taken after fasting for at least 8 hours.   BUN 12/12/2022 12  8 - 23 mg/dL Final   Creatinine, Ser 12/12/2022 0.70  0.61 - 1.24 mg/dL Final   Calcium 16/12/9602 8.7 (L)  8.9 - 10.3 mg/dL Final   GFR, Estimated 12/12/2022 >60  >60 mL/min Final   Comment: (NOTE) Calculated using the CKD-EPI Creatinine Equation (2021)    Anion gap 12/12/2022 7  5 - 15 Final   Performed at [facility], [address], [place], [place] [address]   WBC 12/12/2022 6.5  4.0 - 10.5 K/uL Final   RBC 12/12/2022 4.16 (L)  4.22 - 5.81 MIL/uL Final   Hemoglobin 12/12/2022 12.6 (L)  13.0 - 17.0 g/dL Final   HCT 11/91/4782 38.9 (L)  39.0 - 52.0 % Final   MCV 12/12/2022 93.5  80.0 - 100.0 fL Final   MCH 12/12/2022 30.3  26.0 - 34.0 pg Final   MCHC 12/12/2022 32.4  30.0 - 36.0 g/dL Final   RDW 95/62/1308 13.3  11.5 - 15.5 % Final   Platelets 12/12/2022 307  150 -  400 K/uL Final   nRBC 12/12/2022 0.0  0.0 - 0.2 % Final   Performed at [facility], [address], [place], [place] [address]     X-Rays:DG Knee 1-2 Views Left  Result Date: 12/21/2022 CLINICAL DATA:  Status post total left knee arthroplasty. EXAM: LEFT KNEE - 1-2 VIEW COMPARISON:  None Available. FINDINGS: There is diffuse decreased bone mineralization. Status post left knee arthroplasty. No perihardware lucency is seen to indicate failure or loosening. Expected postoperative changes including small joint effusion, intra-articular air, and anterior subcutaneous air. Mild-to-moderate atherosclerotic calcifications. No acute fracture or dislocation. IMPRESSION: Status post left knee arthroplasty without evidence of hardware failure or loosening. Electronically Signed   By: Curtis Davis M.D.   On: 12/21/2022 16:06    EKG: Orders placed or performed during the hospital encounter of 10/04/22   EKG 12 lead per protocol   EKG 12 lead per protocol     Hospital Course: Curtis Davis is a 77 y.o. who was admitted to [facility]. They were brought to the operating room on 12/21/2022 and underwent Procedure(s): TOTAL KNEE ARTHROPLASTY.  Patient tolerated the procedure well and was later transferred to the recovery room and then to the orthopaedic floor for postoperative care.  They were given PO and IV analgesics for pain control following their surgery.  They were given 24 hours of postoperative antibiotics of  Anti-infectives (From admission, onward)    Start     Dose/Rate Route Frequency Ordered Stop   12/21/22 1800  ceFAZolin (ANCEF) IVPB 2g/100 mL premix        2 g 200 mL/hr over 30 Minutes Intravenous Every 6 hours 12/21/22 1524 12/22/22 0021   12/21/22 0915  ceFAZolin (ANCEF) IVPB 2g/100 mL premix        2 g 200 mL/hr over 30 Minutes Intravenous On call to O.R. 12/21/22 0912 12/21/22 1133      and started on DVT prophylaxis in the form of Aspirin, TED hose,  and SCDs .   PT and OT were ordered for total joint protocol.  Discharge planning consulted to help with postop disposition and equipment needs.  Patient had a good night on the evening of surgery.  They started to get up OOB with therapy on day one. By day one, the patient had progressed with therapy and meeting their goals.  Incision was healing well.  Patient was seen in rounds and was ready to go home.  Diet: Regular diet Activity:WBAT Follow-up:in 10-14 days Disposition - Home Discharged Condition: good    Allergies as of 12/22/2022       Reactions   Penicillins Other (See Comments)   Aggravated poison ivy reaction         Medication List     STOP taking these medications    meloxicam 7.5 MG tablet Commonly known as: MOBIC   TURMERIC PO       TAKE these medications    alfuzosin 10 MG 24 hr tablet Commonly known as: UROXATRAL Take 10 mg by mouth daily.   amLODipine 5 MG tablet Commonly known as: NORVASC Take 5 mg by mouth daily.   aspirin EC 81 MG tablet Take 1 tablet (81 mg total) by mouth 2 (two) times daily after a meal. Day after surgery What changed:  when to take this additional instructions   atorvastatin 20 MG tablet Commonly known as: LIPITOR Take 20 mg by mouth every evening.   docusate sodium 100 MG capsule Commonly known as: Colace Take 1 capsule (100 mg total) by mouth 2 (two) times daily.   gabapentin 300 MG capsule Commonly known as: NEURONTIN Take 1 capsule (300 mg total) by mouth 3 (three) times daily.   ketorolac 10 MG tablet Commonly known as: TORADOL Take 1 tablet (10 mg total) by mouth every 6 (six) hours as needed.   lisinopril 40 MG tablet Commonly known as: ZESTRIL Take 40 mg by mouth daily.   Lysine 500 MG Caps Take 500 mg by mouth daily.   methocarbamol 500 MG tablet Commonly known as: ROBAXIN Take 1 tablet (500 mg total) by mouth every 8 (eight) hours as needed for muscle spasms.   multivitamin with minerals  tablet Take 1 tablet by mouth daily.   omeprazole 20 MG tablet Commonly known as: PRILOSEC OTC Take 20 mg by mouth daily.   oxyCODONE 5 MG immediate release tablet Commonly known as: Oxy IR/ROXICODONE Take 1 tablet (5 mg total) by mouth every 4 (four) hours as needed for severe pain.   polyethylene glycol 17 g packet Commonly known as: MIRALAX / GLYCOLAX Take 17 g by mouth daily.   traZODone 50 MG tablet Commonly known as: DESYREL Take 50-100 mg by mouth at bedtime as needed for sleep.   zolpidem 5 MG tablet Commonly known as: AMBIEN Take 5 mg by mouth at bedtime as needed for sleep.        Follow-up Information     Jene Every, MD. Go on 01/04/2023.   Specialty: Orthopedic Surgery Why: You are scheduled for first post op appt on Wednesday October 16 at 3:15pm. Contact information: [address] [place] [address] [place] [address] [phone]                 Signed: Andrez Grime PA-C Orthopaedic Surgery 12/22/2022, 1:47 PM

## 2022-12-26 DIAGNOSIS — M25562 Pain in left knee: Secondary | ICD-10-CM | POA: Diagnosis not present

## 2022-12-26 DIAGNOSIS — M25662 Stiffness of left knee, not elsewhere classified: Secondary | ICD-10-CM | POA: Diagnosis not present

## 2022-12-28 DIAGNOSIS — M25562 Pain in left knee: Secondary | ICD-10-CM | POA: Diagnosis not present

## 2022-12-28 DIAGNOSIS — M25662 Stiffness of left knee, not elsewhere classified: Secondary | ICD-10-CM | POA: Diagnosis not present

## 2022-12-30 DIAGNOSIS — M25662 Stiffness of left knee, not elsewhere classified: Secondary | ICD-10-CM | POA: Diagnosis not present

## 2022-12-30 DIAGNOSIS — M25562 Pain in left knee: Secondary | ICD-10-CM | POA: Diagnosis not present

## 2023-01-02 DIAGNOSIS — M25662 Stiffness of left knee, not elsewhere classified: Secondary | ICD-10-CM | POA: Diagnosis not present

## 2023-01-04 DIAGNOSIS — Z5189 Encounter for other specified aftercare: Secondary | ICD-10-CM | POA: Diagnosis not present

## 2023-01-04 DIAGNOSIS — M25662 Stiffness of left knee, not elsewhere classified: Secondary | ICD-10-CM | POA: Diagnosis not present

## 2023-01-06 DIAGNOSIS — M25662 Stiffness of left knee, not elsewhere classified: Secondary | ICD-10-CM | POA: Diagnosis not present

## 2023-01-09 DIAGNOSIS — M25662 Stiffness of left knee, not elsewhere classified: Secondary | ICD-10-CM | POA: Diagnosis not present

## 2023-01-11 DIAGNOSIS — M25562 Pain in left knee: Secondary | ICD-10-CM | POA: Diagnosis not present

## 2023-01-11 DIAGNOSIS — M25662 Stiffness of left knee, not elsewhere classified: Secondary | ICD-10-CM | POA: Diagnosis not present

## 2023-01-16 DIAGNOSIS — M25662 Stiffness of left knee, not elsewhere classified: Secondary | ICD-10-CM | POA: Diagnosis not present

## 2023-01-18 DIAGNOSIS — M25662 Stiffness of left knee, not elsewhere classified: Secondary | ICD-10-CM | POA: Diagnosis not present

## 2023-01-23 DIAGNOSIS — M25562 Pain in left knee: Secondary | ICD-10-CM | POA: Diagnosis not present

## 2023-01-23 DIAGNOSIS — M25662 Stiffness of left knee, not elsewhere classified: Secondary | ICD-10-CM | POA: Diagnosis not present

## 2023-01-25 DIAGNOSIS — M25662 Stiffness of left knee, not elsewhere classified: Secondary | ICD-10-CM | POA: Diagnosis not present

## 2023-01-26 DIAGNOSIS — I10 Essential (primary) hypertension: Secondary | ICD-10-CM | POA: Diagnosis not present

## 2023-01-26 DIAGNOSIS — E785 Hyperlipidemia, unspecified: Secondary | ICD-10-CM | POA: Diagnosis not present

## 2023-01-26 DIAGNOSIS — G4700 Insomnia, unspecified: Secondary | ICD-10-CM | POA: Diagnosis not present

## 2023-01-30 DIAGNOSIS — M25662 Stiffness of left knee, not elsewhere classified: Secondary | ICD-10-CM | POA: Diagnosis not present

## 2023-02-01 DIAGNOSIS — M25562 Pain in left knee: Secondary | ICD-10-CM | POA: Diagnosis not present

## 2023-02-01 DIAGNOSIS — M25662 Stiffness of left knee, not elsewhere classified: Secondary | ICD-10-CM | POA: Diagnosis not present

## 2023-03-01 DIAGNOSIS — R3912 Poor urinary stream: Secondary | ICD-10-CM | POA: Diagnosis not present

## 2023-03-01 DIAGNOSIS — R350 Frequency of micturition: Secondary | ICD-10-CM | POA: Diagnosis not present

## 2023-03-01 DIAGNOSIS — N401 Enlarged prostate with lower urinary tract symptoms: Secondary | ICD-10-CM | POA: Diagnosis not present

## 2023-03-01 DIAGNOSIS — R3915 Urgency of urination: Secondary | ICD-10-CM | POA: Diagnosis not present

## 2023-08-09 DIAGNOSIS — Z23 Encounter for immunization: Secondary | ICD-10-CM | POA: Diagnosis not present

## 2023-08-09 DIAGNOSIS — K219 Gastro-esophageal reflux disease without esophagitis: Secondary | ICD-10-CM | POA: Diagnosis not present

## 2023-08-09 DIAGNOSIS — E669 Obesity, unspecified: Secondary | ICD-10-CM | POA: Diagnosis not present

## 2023-08-09 DIAGNOSIS — G4700 Insomnia, unspecified: Secondary | ICD-10-CM | POA: Diagnosis not present

## 2023-08-09 DIAGNOSIS — Z79899 Other long term (current) drug therapy: Secondary | ICD-10-CM | POA: Diagnosis not present

## 2023-08-09 DIAGNOSIS — N400 Enlarged prostate without lower urinary tract symptoms: Secondary | ICD-10-CM | POA: Diagnosis not present

## 2023-08-09 DIAGNOSIS — E785 Hyperlipidemia, unspecified: Secondary | ICD-10-CM | POA: Diagnosis not present

## 2023-08-09 DIAGNOSIS — F439 Reaction to severe stress, unspecified: Secondary | ICD-10-CM | POA: Diagnosis not present

## 2023-08-09 DIAGNOSIS — R7303 Prediabetes: Secondary | ICD-10-CM | POA: Diagnosis not present

## 2023-08-09 DIAGNOSIS — Z0000 Encounter for general adult medical examination without abnormal findings: Secondary | ICD-10-CM | POA: Diagnosis not present

## 2023-08-09 DIAGNOSIS — I10 Essential (primary) hypertension: Secondary | ICD-10-CM | POA: Diagnosis not present

## 2023-10-09 DIAGNOSIS — L820 Inflamed seborrheic keratosis: Secondary | ICD-10-CM | POA: Diagnosis not present

## 2023-10-09 DIAGNOSIS — Z85828 Personal history of other malignant neoplasm of skin: Secondary | ICD-10-CM | POA: Diagnosis not present

## 2023-10-09 DIAGNOSIS — L821 Other seborrheic keratosis: Secondary | ICD-10-CM | POA: Diagnosis not present

## 2023-10-09 DIAGNOSIS — D225 Melanocytic nevi of trunk: Secondary | ICD-10-CM | POA: Diagnosis not present

## 2023-10-09 DIAGNOSIS — L570 Actinic keratosis: Secondary | ICD-10-CM | POA: Diagnosis not present

## 2023-10-09 DIAGNOSIS — L723 Sebaceous cyst: Secondary | ICD-10-CM | POA: Diagnosis not present

## 2023-10-11 DIAGNOSIS — H16103 Unspecified superficial keratitis, bilateral: Secondary | ICD-10-CM | POA: Diagnosis not present

## 2023-10-11 DIAGNOSIS — H52223 Regular astigmatism, bilateral: Secondary | ICD-10-CM | POA: Diagnosis not present

## 2023-10-11 DIAGNOSIS — H2513 Age-related nuclear cataract, bilateral: Secondary | ICD-10-CM | POA: Diagnosis not present

## 2023-10-11 DIAGNOSIS — H5203 Hypermetropia, bilateral: Secondary | ICD-10-CM | POA: Diagnosis not present

## 2023-10-11 DIAGNOSIS — H524 Presbyopia: Secondary | ICD-10-CM | POA: Diagnosis not present

## 2023-10-11 DIAGNOSIS — H35363 Drusen (degenerative) of macula, bilateral: Secondary | ICD-10-CM | POA: Diagnosis not present

## 2023-11-20 DIAGNOSIS — Z23 Encounter for immunization: Secondary | ICD-10-CM | POA: Diagnosis not present

## 2023-11-29 DIAGNOSIS — S80861A Insect bite (nonvenomous), right lower leg, initial encounter: Secondary | ICD-10-CM | POA: Diagnosis not present

## 2023-11-29 DIAGNOSIS — W57XXXA Bitten or stung by nonvenomous insect and other nonvenomous arthropods, initial encounter: Secondary | ICD-10-CM | POA: Diagnosis not present

## 2023-12-13 DIAGNOSIS — Z23 Encounter for immunization: Secondary | ICD-10-CM | POA: Diagnosis not present

## 2024-02-09 DIAGNOSIS — F439 Reaction to severe stress, unspecified: Secondary | ICD-10-CM | POA: Diagnosis not present

## 2024-02-09 DIAGNOSIS — E785 Hyperlipidemia, unspecified: Secondary | ICD-10-CM | POA: Diagnosis not present

## 2024-02-09 DIAGNOSIS — I10 Essential (primary) hypertension: Secondary | ICD-10-CM | POA: Diagnosis not present

## 2024-02-28 DIAGNOSIS — N401 Enlarged prostate with lower urinary tract symptoms: Secondary | ICD-10-CM | POA: Diagnosis not present

## 2024-02-28 DIAGNOSIS — N3941 Urge incontinence: Secondary | ICD-10-CM | POA: Diagnosis not present

## 2024-02-28 DIAGNOSIS — R350 Frequency of micturition: Secondary | ICD-10-CM | POA: Diagnosis not present
